# Patient Record
Sex: Male | Born: 1965 | Hispanic: No | Marital: Married | Smoking: Former smoker
Health system: Southern US, Community
[De-identification: ages and names within clinical notes are randomized; demographics above are authoritative.]

## PROBLEM LIST (undated history)

## (undated) DIAGNOSIS — I10 Essential (primary) hypertension: Secondary | ICD-10-CM

## (undated) DIAGNOSIS — R519 Headache, unspecified: Secondary | ICD-10-CM

## (undated) DIAGNOSIS — E041 Nontoxic single thyroid nodule: Secondary | ICD-10-CM

## (undated) DIAGNOSIS — J189 Pneumonia, unspecified organism: Secondary | ICD-10-CM

## (undated) DIAGNOSIS — K219 Gastro-esophageal reflux disease without esophagitis: Secondary | ICD-10-CM

## (undated) DIAGNOSIS — R011 Cardiac murmur, unspecified: Secondary | ICD-10-CM

## (undated) DIAGNOSIS — J449 Chronic obstructive pulmonary disease, unspecified: Secondary | ICD-10-CM

## (undated) DIAGNOSIS — C801 Malignant (primary) neoplasm, unspecified: Secondary | ICD-10-CM

## (undated) HISTORY — DX: Cardiac murmur, unspecified: R01.1

## (undated) HISTORY — DX: Essential (primary) hypertension: I10

## (undated) HISTORY — DX: Malignant (primary) neoplasm, unspecified: C80.1

## (undated) HISTORY — PX: FRACTURE SURGERY: SHX138

## (undated) HISTORY — DX: Gastro-esophageal reflux disease without esophagitis: K21.9

## (undated) HISTORY — DX: Chronic obstructive pulmonary disease, unspecified: J44.9

---

## 2019-04-15 DIAGNOSIS — Z20828 Contact with and (suspected) exposure to other viral communicable diseases: Secondary | ICD-10-CM | POA: Diagnosis not present

## 2019-04-21 DIAGNOSIS — U071 COVID-19: Secondary | ICD-10-CM | POA: Diagnosis not present

## 2019-04-21 DIAGNOSIS — Z20828 Contact with and (suspected) exposure to other viral communicable diseases: Secondary | ICD-10-CM | POA: Diagnosis not present

## 2019-06-02 DIAGNOSIS — L300 Nummular dermatitis: Secondary | ICD-10-CM | POA: Diagnosis not present

## 2019-08-20 DIAGNOSIS — Z7689 Persons encountering health services in other specified circumstances: Secondary | ICD-10-CM | POA: Diagnosis not present

## 2019-08-20 DIAGNOSIS — E785 Hyperlipidemia, unspecified: Secondary | ICD-10-CM | POA: Diagnosis not present

## 2019-08-20 DIAGNOSIS — E66811 Obesity, class 1: Secondary | ICD-10-CM | POA: Insufficient documentation

## 2019-08-20 DIAGNOSIS — I471 Supraventricular tachycardia, unspecified: Secondary | ICD-10-CM | POA: Insufficient documentation

## 2019-08-20 DIAGNOSIS — R002 Palpitations: Secondary | ICD-10-CM | POA: Diagnosis not present

## 2019-08-20 DIAGNOSIS — I2510 Atherosclerotic heart disease of native coronary artery without angina pectoris: Secondary | ICD-10-CM | POA: Insufficient documentation

## 2019-08-20 DIAGNOSIS — I10 Essential (primary) hypertension: Secondary | ICD-10-CM | POA: Diagnosis not present

## 2019-10-02 DIAGNOSIS — Z125 Encounter for screening for malignant neoplasm of prostate: Secondary | ICD-10-CM | POA: Diagnosis not present

## 2019-10-02 DIAGNOSIS — E785 Hyperlipidemia, unspecified: Secondary | ICD-10-CM | POA: Diagnosis not present

## 2019-10-02 DIAGNOSIS — Z8572 Personal history of non-Hodgkin lymphomas: Secondary | ICD-10-CM | POA: Insufficient documentation

## 2019-10-02 DIAGNOSIS — I2510 Atherosclerotic heart disease of native coronary artery without angina pectoris: Secondary | ICD-10-CM | POA: Diagnosis not present

## 2019-10-02 DIAGNOSIS — E049 Nontoxic goiter, unspecified: Secondary | ICD-10-CM | POA: Diagnosis not present

## 2019-10-02 DIAGNOSIS — I10 Essential (primary) hypertension: Secondary | ICD-10-CM | POA: Diagnosis not present

## 2019-10-02 DIAGNOSIS — J449 Chronic obstructive pulmonary disease, unspecified: Secondary | ICD-10-CM | POA: Diagnosis not present

## 2019-12-17 DIAGNOSIS — Z20822 Contact with and (suspected) exposure to covid-19: Secondary | ICD-10-CM | POA: Diagnosis not present

## 2019-12-17 DIAGNOSIS — Z03818 Encounter for observation for suspected exposure to other biological agents ruled out: Secondary | ICD-10-CM | POA: Diagnosis not present

## 2020-02-13 DIAGNOSIS — L578 Other skin changes due to chronic exposure to nonionizing radiation: Secondary | ICD-10-CM | POA: Diagnosis not present

## 2020-02-13 DIAGNOSIS — L814 Other melanin hyperpigmentation: Secondary | ICD-10-CM | POA: Diagnosis not present

## 2020-02-13 DIAGNOSIS — L905 Scar conditions and fibrosis of skin: Secondary | ICD-10-CM | POA: Diagnosis not present

## 2020-02-13 DIAGNOSIS — L570 Actinic keratosis: Secondary | ICD-10-CM | POA: Diagnosis not present

## 2020-02-13 DIAGNOSIS — D1801 Hemangioma of skin and subcutaneous tissue: Secondary | ICD-10-CM | POA: Diagnosis not present

## 2020-08-09 DIAGNOSIS — L814 Other melanin hyperpigmentation: Secondary | ICD-10-CM | POA: Diagnosis not present

## 2020-08-09 DIAGNOSIS — D1801 Hemangioma of skin and subcutaneous tissue: Secondary | ICD-10-CM | POA: Diagnosis not present

## 2020-08-09 DIAGNOSIS — D485 Neoplasm of uncertain behavior of skin: Secondary | ICD-10-CM | POA: Diagnosis not present

## 2020-08-09 DIAGNOSIS — D225 Melanocytic nevi of trunk: Secondary | ICD-10-CM | POA: Diagnosis not present

## 2020-08-09 DIAGNOSIS — L905 Scar conditions and fibrosis of skin: Secondary | ICD-10-CM | POA: Diagnosis not present

## 2020-08-09 DIAGNOSIS — L821 Other seborrheic keratosis: Secondary | ICD-10-CM | POA: Diagnosis not present

## 2020-10-26 DIAGNOSIS — I10 Essential (primary) hypertension: Secondary | ICD-10-CM | POA: Diagnosis not present

## 2020-10-26 DIAGNOSIS — E782 Mixed hyperlipidemia: Secondary | ICD-10-CM | POA: Diagnosis not present

## 2020-10-26 DIAGNOSIS — J439 Emphysema, unspecified: Secondary | ICD-10-CM | POA: Diagnosis not present

## 2020-12-22 DIAGNOSIS — U071 COVID-19: Secondary | ICD-10-CM | POA: Diagnosis not present

## 2021-02-07 DIAGNOSIS — L820 Inflamed seborrheic keratosis: Secondary | ICD-10-CM | POA: Diagnosis not present

## 2021-02-07 DIAGNOSIS — L905 Scar conditions and fibrosis of skin: Secondary | ICD-10-CM | POA: Diagnosis not present

## 2021-02-07 DIAGNOSIS — L538 Other specified erythematous conditions: Secondary | ICD-10-CM | POA: Diagnosis not present

## 2021-02-07 DIAGNOSIS — L918 Other hypertrophic disorders of the skin: Secondary | ICD-10-CM | POA: Diagnosis not present

## 2021-02-07 DIAGNOSIS — L568 Other specified acute skin changes due to ultraviolet radiation: Secondary | ICD-10-CM | POA: Diagnosis not present

## 2021-02-07 DIAGNOSIS — Z789 Other specified health status: Secondary | ICD-10-CM | POA: Diagnosis not present

## 2021-02-14 ENCOUNTER — Other Ambulatory Visit (HOSPITAL_BASED_OUTPATIENT_CLINIC_OR_DEPARTMENT_OTHER): Payer: Self-pay | Admitting: Family Medicine

## 2021-02-14 DIAGNOSIS — R1011 Right upper quadrant pain: Secondary | ICD-10-CM

## 2021-02-21 ENCOUNTER — Ambulatory Visit (HOSPITAL_BASED_OUTPATIENT_CLINIC_OR_DEPARTMENT_OTHER)
Admission: RE | Admit: 2021-02-21 | Discharge: 2021-02-21 | Disposition: A | Discharge status: Home / Self Care | Payer: BC Managed Care – PPO | Source: Ambulatory Visit | Attending: Family Medicine | Admitting: Family Medicine

## 2021-02-21 ENCOUNTER — Other Ambulatory Visit: Payer: Self-pay

## 2021-02-21 DIAGNOSIS — R1011 Right upper quadrant pain: Secondary | ICD-10-CM | POA: Insufficient documentation

## 2021-03-03 DIAGNOSIS — R1011 Right upper quadrant pain: Secondary | ICD-10-CM | POA: Diagnosis not present

## 2021-03-16 ENCOUNTER — Other Ambulatory Visit: Payer: Self-pay | Admitting: Physician Assistant

## 2021-03-16 DIAGNOSIS — Z1211 Encounter for screening for malignant neoplasm of colon: Secondary | ICD-10-CM | POA: Diagnosis not present

## 2021-03-16 DIAGNOSIS — R109 Unspecified abdominal pain: Secondary | ICD-10-CM

## 2021-03-16 DIAGNOSIS — K219 Gastro-esophageal reflux disease without esophagitis: Secondary | ICD-10-CM | POA: Diagnosis not present

## 2021-04-06 ENCOUNTER — Other Ambulatory Visit: Payer: Self-pay

## 2021-04-06 ENCOUNTER — Ambulatory Visit
Admission: RE | Admit: 2021-04-06 | Discharge: 2021-04-06 | Disposition: A | Discharge status: Home / Self Care | Payer: BC Managed Care – PPO | Source: Ambulatory Visit | Attending: Physician Assistant | Admitting: Physician Assistant

## 2021-04-06 DIAGNOSIS — M7989 Other specified soft tissue disorders: Secondary | ICD-10-CM | POA: Diagnosis not present

## 2021-04-06 DIAGNOSIS — J8410 Pulmonary fibrosis, unspecified: Secondary | ICD-10-CM | POA: Diagnosis not present

## 2021-04-06 DIAGNOSIS — R109 Unspecified abdominal pain: Secondary | ICD-10-CM | POA: Diagnosis not present

## 2021-04-06 DIAGNOSIS — K5730 Diverticulosis of large intestine without perforation or abscess without bleeding: Secondary | ICD-10-CM | POA: Diagnosis not present

## 2021-04-06 DIAGNOSIS — K429 Umbilical hernia without obstruction or gangrene: Secondary | ICD-10-CM | POA: Diagnosis not present

## 2021-04-06 DIAGNOSIS — I700 Atherosclerosis of aorta: Secondary | ICD-10-CM | POA: Diagnosis not present

## 2021-04-06 MED ORDER — IOPAMIDOL (ISOVUE-300) INJECTION 61%
100.0000 mL | Freq: Once | INTRAVENOUS | Status: AC | PRN
  Administered 2021-04-06: 100 mL via INTRAVENOUS

## 2021-04-25 DIAGNOSIS — Z125 Encounter for screening for malignant neoplasm of prostate: Secondary | ICD-10-CM | POA: Diagnosis not present

## 2021-04-25 DIAGNOSIS — J439 Emphysema, unspecified: Secondary | ICD-10-CM | POA: Diagnosis not present

## 2021-04-25 DIAGNOSIS — R109 Unspecified abdominal pain: Secondary | ICD-10-CM | POA: Diagnosis not present

## 2021-04-25 DIAGNOSIS — Z1211 Encounter for screening for malignant neoplasm of colon: Secondary | ICD-10-CM | POA: Diagnosis not present

## 2021-04-25 DIAGNOSIS — E782 Mixed hyperlipidemia: Secondary | ICD-10-CM | POA: Diagnosis not present

## 2021-04-25 DIAGNOSIS — I10 Essential (primary) hypertension: Secondary | ICD-10-CM | POA: Diagnosis not present

## 2021-04-25 DIAGNOSIS — Z0000 Encounter for general adult medical examination without abnormal findings: Secondary | ICD-10-CM | POA: Diagnosis not present

## 2021-05-04 DIAGNOSIS — E049 Nontoxic goiter, unspecified: Secondary | ICD-10-CM | POA: Diagnosis not present

## 2021-05-04 DIAGNOSIS — K219 Gastro-esophageal reflux disease without esophagitis: Secondary | ICD-10-CM | POA: Diagnosis not present

## 2021-05-04 DIAGNOSIS — Z1211 Encounter for screening for malignant neoplasm of colon: Secondary | ICD-10-CM | POA: Diagnosis not present

## 2021-05-04 DIAGNOSIS — R109 Unspecified abdominal pain: Secondary | ICD-10-CM | POA: Diagnosis not present

## 2021-08-02 DIAGNOSIS — D120 Benign neoplasm of cecum: Secondary | ICD-10-CM | POA: Diagnosis not present

## 2021-08-02 DIAGNOSIS — Z1211 Encounter for screening for malignant neoplasm of colon: Secondary | ICD-10-CM | POA: Diagnosis not present

## 2021-08-02 DIAGNOSIS — D128 Benign neoplasm of rectum: Secondary | ICD-10-CM | POA: Diagnosis not present

## 2021-08-02 DIAGNOSIS — K2930 Chronic superficial gastritis without bleeding: Secondary | ICD-10-CM | POA: Diagnosis not present

## 2021-08-02 DIAGNOSIS — D123 Benign neoplasm of transverse colon: Secondary | ICD-10-CM | POA: Diagnosis not present

## 2021-08-02 DIAGNOSIS — K219 Gastro-esophageal reflux disease without esophagitis: Secondary | ICD-10-CM | POA: Diagnosis not present

## 2021-08-02 DIAGNOSIS — D124 Benign neoplasm of descending colon: Secondary | ICD-10-CM | POA: Diagnosis not present

## 2021-08-02 DIAGNOSIS — D175 Benign lipomatous neoplasm of intra-abdominal organs: Secondary | ICD-10-CM | POA: Diagnosis not present

## 2021-08-02 DIAGNOSIS — R1011 Right upper quadrant pain: Secondary | ICD-10-CM | POA: Diagnosis not present

## 2021-08-02 DIAGNOSIS — D122 Benign neoplasm of ascending colon: Secondary | ICD-10-CM | POA: Diagnosis not present

## 2021-08-02 DIAGNOSIS — D125 Benign neoplasm of sigmoid colon: Secondary | ICD-10-CM | POA: Diagnosis not present

## 2021-08-02 DIAGNOSIS — K2990 Gastroduodenitis, unspecified, without bleeding: Secondary | ICD-10-CM | POA: Diagnosis not present

## 2021-08-02 LAB — HM COLONOSCOPY

## 2021-08-10 DIAGNOSIS — R933 Abnormal findings on diagnostic imaging of other parts of digestive tract: Secondary | ICD-10-CM | POA: Diagnosis not present

## 2021-08-10 DIAGNOSIS — K219 Gastro-esophageal reflux disease without esophagitis: Secondary | ICD-10-CM | POA: Diagnosis not present

## 2021-08-17 DIAGNOSIS — K219 Gastro-esophageal reflux disease without esophagitis: Secondary | ICD-10-CM | POA: Diagnosis not present

## 2021-08-29 DIAGNOSIS — J020 Streptococcal pharyngitis: Secondary | ICD-10-CM | POA: Diagnosis not present

## 2021-08-29 DIAGNOSIS — R059 Cough, unspecified: Secondary | ICD-10-CM | POA: Diagnosis not present

## 2021-12-13 ENCOUNTER — Encounter (HOSPITAL_BASED_OUTPATIENT_CLINIC_OR_DEPARTMENT_OTHER): Payer: Self-pay | Admitting: Family Medicine

## 2021-12-13 ENCOUNTER — Ambulatory Visit (INDEPENDENT_AMBULATORY_CARE_PROVIDER_SITE_OTHER): Payer: BC Managed Care – PPO | Admitting: Family Medicine

## 2021-12-13 DIAGNOSIS — I10 Essential (primary) hypertension: Secondary | ICD-10-CM

## 2021-12-13 DIAGNOSIS — J449 Chronic obstructive pulmonary disease, unspecified: Secondary | ICD-10-CM | POA: Diagnosis not present

## 2021-12-13 DIAGNOSIS — Z0000 Encounter for general adult medical examination without abnormal findings: Secondary | ICD-10-CM

## 2021-12-13 DIAGNOSIS — R109 Unspecified abdominal pain: Secondary | ICD-10-CM

## 2021-12-13 DIAGNOSIS — S80861A Insect bite (nonvenomous), right lower leg, initial encounter: Secondary | ICD-10-CM

## 2021-12-13 DIAGNOSIS — W57XXXA Bitten or stung by nonvenomous insect and other nonvenomous arthropods, initial encounter: Secondary | ICD-10-CM

## 2021-12-13 DIAGNOSIS — E785 Hyperlipidemia, unspecified: Secondary | ICD-10-CM | POA: Diagnosis not present

## 2021-12-13 NOTE — Assessment & Plan Note (Signed)
Blood pressure at goal in office today, can continue with current regimen Recommend intermittent monitoring of blood pressure at home, DASH diet

## 2021-12-13 NOTE — Assessment & Plan Note (Signed)
Plan to check lipid panel with upcoming labs for physical Has been tolerating rosuvastatin, recommend continuing with this

## 2021-12-13 NOTE — Progress Notes (Signed)
New Patient Office Visit  Subjective    Patient ID: Kyle Nielsen., male    DOB: [birth year]  Age: 56 y.o. MRN: [id number]  CC:  Chief Complaint  Patient presents with   New Patient (Initial Visit)    Patient presents today to establish care. Right side abdominal pain x 1 year.     HPI Kyle Mcdonald. presents to establish care Last PCP - Deboraha Sprang, last visit was a few months ago.  HTN: Diagnosed many years ago, current medications include amlodipine, lisinopril, metoprolol.  Medications have been updated in chart.  Denies any current issues with chest pain or headaches.  Does not check blood pressure regularly at home.  Not needing refills today.  COPD: Reports being diagnosed by pulmonologist when he was living in [place].  Started on Trelegy at that time, continues with this once daily.  Denies any current issues with shortness of breath or cough.  HLD: Currently taking rosuvastatin, tolerating medication well, denies any issues or concerns at this time.  Abdominal pain: intermittent over the past year or so. More recently has been improving. Not aware of any specific aggravating factors. Has had some small weight gain, not having any weight loss. Had evaluation with PCP at [facility], did have recommendation for evaluation with GI, had endoscopy and colonoscopy completed which he reports did not show anything.  At end of visit, reported some chronic fatigue Additionally he also mentioned exposures to tick in the past, indicating that he found one on his lower right leg.  Due to this he requested to have testing for Lyme disease in conjunction with upcoming labs  Patient is originally from [place], lived in [place] for past 35 years. Moved to the area about 3 years ago. Patient owns a [employer]. Outside of work, he enjoys relaxing, fishing.  Outpatient Encounter Medications as of 12/13/2021  Medication Sig   amLODipine (NORVASC) 10 MG tablet Take 10 mg by  mouth daily.   Fluticasone-Umeclidin-Vilant 100-62.5-25 MCG/ACT AEPB Inhale 1 Inhalation into the lungs daily.   lisinopril (ZESTRIL) 40 MG tablet Take 40 mg by mouth daily.   metoprolol tartrate (LOPRESSOR) 25 MG tablet Take 25 mg by mouth 2 (two) times daily.   pantoprazole (PROTONIX) 40 MG tablet Take 40 mg by mouth daily.   rosuvastatin (CRESTOR) 20 MG tablet Take 20 mg by mouth daily.   [DISCONTINUED] amlodipine-atorvastatin (CADUET) 10-10 MG tablet Take 1 tablet by mouth daily.   [DISCONTINUED] metoprolol-hydrochlorothiazide (LOPRESSOR HCT) 50-25 MG tablet Take 1 tablet by mouth daily.   No facility-administered encounter medications on file as of 12/13/2021.    History reviewed. No pertinent past medical history.  History reviewed. No pertinent surgical history.  History reviewed. No pertinent family history.  Social History   Socioeconomic History   Marital status: Married    Spouse name: Claris Che   Number of children: 2   Years of education: Not on file   Highest education level: Not on file  Occupational History   Not on file  Tobacco Use   Smoking status: Former    Types: Cigarettes    Quit date: 2012    Years since quitting: 11.6   Smokeless tobacco: Never  Substance and Sexual Activity   Alcohol use: Yes    Comment: couple beers a day   Drug use: Never   Sexual activity: Yes    Birth control/protection: None  Other Topics Concern   Not on file  Social History Narrative  Not on file   Social Determinants of Health   Financial Resource Strain: Not on file  Food Insecurity: Not on file  Transportation Needs: Not on file  Physical Activity: Not on file  Stress: Not on file  Social Connections: Not on file  Intimate Partner Violence: Not on file    Objective    BP 135/74   Pulse 79   Temp 98.7 F (37.1 C)   Ht 5\' 9"  (1.753 m)   Wt 224 lb (101.6 kg)   SpO2 97%   BMI 33.08 kg/m   Physical Exam  56 year old male in no acute  distress Cardiovascular exam with regular rate and rhythm, soft systolic murmur appreciated Lungs clear to auscultation bilaterally Abdomen with normal bowel sounds, soft, very mild tenderness along right flank.  No organomegaly.  Assessment & Plan:   Problem List Items Addressed This Visit       Cardiovascular and Mediastinum   Primary hypertension    Blood pressure at goal in office today, can continue with current regimen Recommend intermittent monitoring of blood pressure at home, DASH diet      Relevant Medications   rosuvastatin (CRESTOR) 20 MG tablet   lisinopril (ZESTRIL) 40 MG tablet   amLODipine (NORVASC) 10 MG tablet   metoprolol tartrate (LOPRESSOR) 25 MG tablet     Respiratory   COPD (chronic obstructive pulmonary disease) (HCC)    Diagnosed in the past, is doing well currently with trilogy.  Recommend continuing with regular use of inhaler Continue to monitor progress and need for PFTs in the future.  Consider referral to establish with pulmonologist locally      Relevant Medications   Fluticasone-Umeclidin-Vilant 100-62.5-25 MCG/ACT AEPB     Musculoskeletal and Integument   Tick bite of right lower leg    Patient reports that he had a tick bite to his right lower leg in the past with some surrounding redness and swelling.  He is requesting to have Lyme testing completed.  Orders have been placed to have Lyme testing done with upcoming labs      Relevant Orders   Lyme Disease Serology w/Reflex     Other   Hyperlipidemia    Plan to check lipid panel with upcoming labs for physical Has been tolerating rosuvastatin, recommend continuing with this      Relevant Medications   rosuvastatin (CRESTOR) 20 MG tablet   lisinopril (ZESTRIL) 40 MG tablet   amLODipine (NORVASC) 10 MG tablet   metoprolol tartrate (LOPRESSOR) 25 MG tablet   Abdominal pain    Primarily along right side of abdomen, uncertain etiology, exam today is largely unremarkable We will request  records from prior PCP to review evaluation which has been completed previously including labs and imaging.  Recommendations for further evaluation pending review of records We will plan to complete baseline labs with upcoming physical      Other Visit Diagnoses     Wellness examination       Relevant Orders   CBC with Differential/Platelet   Comprehensive metabolic panel   Hemoglobin A1c   Lipid panel   TSH Rfx on Abnormal to Free T4       Return in about 2 months (around 02/12/2022) for CPE with [facility] a few days prior.   Kellie Murrill J De 04/14/2022, MD

## 2021-12-13 NOTE — Assessment & Plan Note (Signed)
Diagnosed in the past, is doing well currently with trilogy.  Recommend continuing with regular use of inhaler Continue to monitor progress and need for PFTs in the future.  Consider referral to establish with pulmonologist locally

## 2021-12-13 NOTE — Assessment & Plan Note (Signed)
Patient reports that he had a tick bite to his right lower leg in the past with some surrounding redness and swelling.  He is requesting to have Lyme testing completed.  Orders have been placed to have Lyme testing done with upcoming labs

## 2021-12-13 NOTE — Patient Instructions (Signed)
  Medication Instructions:  Your physician recommends that you continue on your current medications as directed. Please refer to the Current Medication list given to you today. --If you need a refill on any your medications before your next appointment, please call your pharmacy first. If no refills are authorized on file call the office.-- Lab Work: Your physician has recommended that you have lab work today: nurse visit one week prior to appointment fasting labs If you have labs (blood work) drawn today and your tests are completely normal, you will receive your results via MyChart message OR a phone call from our staff.  Please ensure you check your voicemail in the event that you authorized detailed messages to be left on a delegated number. If you have any lab test that is abnormal or we need to change your treatment, we will call you to review the results.  R  Follow-Up: Your next appointment:   Your physician recommends that you schedule a follow-up appointment in: 2-3 months CPE with Dr. de [place]  You will receive a text message or e-mail with a link to a survey about your care and experience with [place] today! We would greatly appreciate your feedback!   Thanks for letting us be apart of your health journey!!  Primary Care and Sports Medicine   Dr. Ceasar Mons [place]   We encourage you to activate your patient portal called "MyChart".  Sign up information is provided on this After Visit Summary.  MyChart is used to connect with patients for Virtual Visits (Telemedicine).  Patients are able to view lab/test results, encounter notes, upcoming appointments, etc.  Non-urgent messages can be sent to your provider as well. To learn more about what you can do with MyChart, please visit --  [email or web address].

## 2021-12-13 NOTE — Assessment & Plan Note (Signed)
Primarily along right side of abdomen, uncertain etiology, exam today is largely unremarkable We will request records from prior PCP to review evaluation which has been completed previously including labs and imaging.  Recommendations for further evaluation pending review of records We will plan to complete baseline labs with upcoming physical

## 2022-02-06 ENCOUNTER — Ambulatory Visit (HOSPITAL_BASED_OUTPATIENT_CLINIC_OR_DEPARTMENT_OTHER): Payer: BC Managed Care – PPO

## 2022-02-06 ENCOUNTER — Encounter (HOSPITAL_BASED_OUTPATIENT_CLINIC_OR_DEPARTMENT_OTHER): Payer: Self-pay

## 2022-02-13 ENCOUNTER — Ambulatory Visit (INDEPENDENT_AMBULATORY_CARE_PROVIDER_SITE_OTHER): Payer: BC Managed Care – PPO | Admitting: Family Medicine

## 2022-02-13 ENCOUNTER — Encounter (HOSPITAL_BASED_OUTPATIENT_CLINIC_OR_DEPARTMENT_OTHER): Payer: Self-pay | Admitting: Family Medicine

## 2022-02-13 DIAGNOSIS — E049 Nontoxic goiter, unspecified: Secondary | ICD-10-CM | POA: Diagnosis not present

## 2022-02-13 DIAGNOSIS — S80861A Insect bite (nonvenomous), right lower leg, initial encounter: Secondary | ICD-10-CM | POA: Diagnosis not present

## 2022-02-13 DIAGNOSIS — Z0000 Encounter for general adult medical examination without abnormal findings: Secondary | ICD-10-CM

## 2022-02-13 DIAGNOSIS — W57XXXA Bitten or stung by nonvenomous insect and other nonvenomous arthropods, initial encounter: Secondary | ICD-10-CM | POA: Diagnosis not present

## 2022-02-13 NOTE — Assessment & Plan Note (Signed)
Routine HCM labs ordered. HCM reviewed/discussed. Anticipatory guidance regarding healthy weight, lifestyle and choices given. Recommend healthy diet.  Recommend approximately 150 minutes/week of moderate intensity exercise Recommend regular dental and vision exams Always use seatbelt/lap and shoulder restraints Recommend using smoke alarms and checking batteries at least twice a year Recommend using sunscreen when outside Discussed colon cancer screening recommendations, options.  Patient has had colonoscopy with [facility] in the recent past Discussed recommendations for shingles vaccine.  Patient declines at this time Discussed tetanus immunization recommendations, patient is due but declines Recommend season flu vaccine, patient declines today

## 2022-02-13 NOTE — Progress Notes (Unsigned)
Subjective:    CC: Annual Physical Exam  HPI:  Kyle Demma. is a 56 y.o. presenting for annual physical  I reviewed the past medical history, family history, social history, surgical history, and allergies today and no changes were needed.  Please see the problem list section below in epic for further details.  Past Medical History: History reviewed. No pertinent past medical history. Past Surgical History: History reviewed. No pertinent surgical history. Social History: Social History   Socioeconomic History   Marital status: Married    Spouse name: Kyle Mcdonald   Number of children: 2   Years of education: Not on file   Highest education level: Not on file  Occupational History   Not on file  Tobacco Use   Smoking status: Former    Types: Cigarettes    Quit date: 2012    Years since quitting: 11.7   Smokeless tobacco: Never  Substance and Sexual Activity   Alcohol use: Yes    Comment: couple beers a day   Drug use: Never   Sexual activity: Yes    Birth control/protection: None  Other Topics Concern   Not on file  Social History Narrative   Not on file   Social Determinants of Health   Financial Resource Strain: Not on file  Food Insecurity: Not on file  Transportation Needs: Not on file  Physical Activity: Not on file  Stress: Not on file  Social Connections: Not on file   Family History: History reviewed. No pertinent family history. Allergies: Not on File Medications: See med rec.  Review of Systems: No headache, visual changes, nausea, vomiting, diarrhea, constipation, dizziness, abdominal pain, skin rash, fevers, chills, night sweats, swollen lymph nodes, weight loss, chest pain, body aches, joint swelling, muscle aches, shortness of breath, mood changes, visual or auditory hallucinations.  Objective:    BP 130/82   Pulse 74   Temp 97.6 F (36.4 C) (Oral)   Ht 5\' 9"  (1.753 m)   Wt 223 lb 6.4 oz (101.3 kg)   SpO2 97%   BMI 32.99 kg/m    General: Well Developed, well nourished, and in no acute distress.  Neuro: Alert and oriented x3, extra-ocular muscles intact, sensation grossly intact. Cranial nerves II through XII are intact, motor, sensory, and coordinative functions are all intact. HEENT: Normocephalic, atraumatic, pupils equal round reactive to light, neck supple, no lymphadenopathy, thyroid enlarged along right side of neck. Oropharynx, nasopharynx, external ear canals are unremarkable. Skin: Warm and dry, no rashes noted.  Cardiac: Regular rate and rhythm, no murmurs rubs or gallops.  Respiratory: Clear to auscultation bilaterally. Not using accessory muscles, speaking in full sentences.  Abdominal: Soft, nontender, nondistended, positive bowel sounds, no masses, no organomegaly.  Musculoskeletal: Shoulder, elbow, wrist, hip, knee, ankle stable, and with full range of motion.  Impression and Recommendations:    Wellness examination Routine HCM labs ordered. HCM reviewed/discussed. Anticipatory guidance regarding healthy weight, lifestyle and choices given. Recommend healthy diet.  Recommend approximately 150 minutes/week of moderate intensity exercise Recommend regular dental and vision exams Always use seatbelt/lap and shoulder restraints Recommend using smoke alarms and checking batteries at least twice a year Recommend using sunscreen when outside Discussed colon cancer screening recommendations, options.  Patient has had colonoscopy with [facility] in the recent past Discussed recommendations for shingles vaccine.  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  He reports prior evaluation with biopsy many years ago, thinks it was about 20 years ago that this was done.  Indicates that results at that time were reportedly reassuring and he was told that  no further testing or treatment was needed He has not had any new symptoms, no obstructive symptoms. We will check TSH, TPO antibodies and determine need for further evaluation at this time such as ultrasound or possible biopsy  Return in about 3 months (around 05/16/2022) for HTN, HLD.   ___________________________________________ Kyle Livingood de [place], MD, ABFM, [facility] and [place]

## 2022-02-13 NOTE — Patient Instructions (Signed)
  Medication Instructions:  Your physician recommends that you continue on your current medications as directed. Please refer to the Current Medication list given to you today. --If you need a refill on any your medications before your next appointment, please call your pharmacy first. If no refills are authorized on file call the office.-- Lab Work: Your physician has recommended that you have lab work today: Yes If you have labs (blood work) drawn today and your tests are completely normal, you will receive your results via MyChart message OR a phone call from our staff.  Please ensure you check your voicemail in the event that you authorized detailed messages to be left on a delegated number. If you have any lab test that is abnormal or we need to change your treatment, we will call you to review the results.  Referrals/Procedures/Imaging: No  Follow-Up: Your next appointment:   Your physician recommends that you schedule a follow-up appointment in: 3-4 months follow-up with Dr. de [place].  You will receive a text message or e-mail with a link to a survey about your care and experience with us today! We would greatly appreciate your feedback!   Thanks for letting us be apart of your health journey!!  Primary Care and Sports Medicine   Dr. Raymond de [place]   We encourage you to activate your patient portal called "MyChart".  Sign up information is provided on this After Visit Summary.  MyChart is used to connect with patients for Virtual Visits (Telemedicine).  Patients are able to view lab/test results, encounter notes, upcoming appointments, etc.  Non-urgent messages can be sent to your provider as well. To learn more about what you can do with MyChart, please visit --  [email or web address]    

## 2022-02-14 LAB — COMPREHENSIVE METABOLIC PANEL
ALT: 40 IU/L (ref 0–44)
AST: 23 IU/L (ref 0–40)
Albumin/Globulin Ratio: 1.9 (ref 1.2–2.2)
Albumin: 4.3 g/dL (ref 3.8–4.9)
Alkaline Phosphatase: 93 IU/L (ref 44–121)
BUN/Creatinine Ratio: 13 (ref 9–20)
BUN: 13 mg/dL (ref 6–24)
Bilirubin Total: 0.7 mg/dL (ref 0.0–1.2)
CO2: 20 mmol/L (ref 20–29)
Calcium: 9.2 mg/dL (ref 8.7–10.2)
Chloride: 105 mmol/L (ref 96–106)
Creatinine, Ser: 1.04 mg/dL (ref 0.76–1.27)
Globulin, Total: 2.3 g/dL (ref 1.5–4.5)
Glucose: 139 mg/dL — ABNORMAL HIGH (ref 70–99)
Potassium: 4.3 mmol/L (ref 3.5–5.2)
Sodium: 141 mmol/L (ref 134–144)
Total Protein: 6.6 g/dL (ref 6.0–8.5)
eGFR: 84 mL/min/{1.73_m2} (ref >59)

## 2022-02-14 LAB — LIPID PANEL
Chol/HDL Ratio: 2.7 ratio (ref 0.0–5.0)
Cholesterol, Total: 86 mg/dL — ABNORMAL LOW (ref 100–199)
HDL: 32 mg/dL — ABNORMAL LOW (ref >39)
LDL Chol Calc (NIH): 38 mg/dL (ref 0–99)
Triglycerides: 72 mg/dL (ref 0–149)
VLDL Cholesterol Cal: 16 mg/dL (ref 5–40)

## 2022-02-14 LAB — HEMOGLOBIN A1C
Est. average glucose Bld gHb Est-mCnc: 120 mg/dL
Hgb A1c MFr Bld: 5.8 % — ABNORMAL HIGH (ref 4.8–5.6)

## 2022-02-14 LAB — CBC WITH DIFFERENTIAL/PLATELET
Basophils Absolute: 0.1 10*3/uL (ref 0.0–0.2)
Basos: 1 %
EOS (ABSOLUTE): 0.3 10*3/uL (ref 0.0–0.4)
Eos: 4 %
Hematocrit: 46 % (ref 37.5–51.0)
Hemoglobin: 16.2 g/dL (ref 13.0–17.7)
Immature Grans (Abs): 0 10*3/uL (ref 0.0–0.1)
Immature Granulocytes: 1 %
Lymphocytes Absolute: 1.3 10*3/uL (ref 0.7–3.1)
Lymphs: 23 %
MCH: 31.3 pg (ref 26.6–33.0)
MCHC: 35.2 g/dL (ref 31.5–35.7)
MCV: 89 fL (ref 79–97)
Monocytes Absolute: 0.7 10*3/uL (ref 0.1–0.9)
Monocytes: 11 %
Neutrophils Absolute: 3.4 10*3/uL (ref 1.4–7.0)
Neutrophils: 60 %
Platelets: 196 10*3/uL (ref 150–450)
RBC: 5.17 x10E6/uL (ref 4.14–5.80)
RDW: 12.3 % (ref 11.6–15.4)
WBC: 5.7 10*3/uL (ref 3.4–10.8)

## 2022-02-14 LAB — TSH RFX ON ABNORMAL TO FREE T4: TSH: 6.27 u[IU]/mL — ABNORMAL HIGH (ref 0.450–4.500)

## 2022-02-14 LAB — T4F: T4,Free (Direct): 1.22 ng/dL (ref 0.82–1.77)

## 2022-02-14 LAB — LYME DISEASE SEROLOGY W/REFLEX: Lyme Total Antibody EIA: NEGATIVE

## 2022-02-14 LAB — THYROID PEROXIDASE ANTIBODY: Thyroperoxidase Ab SerPl-aCnc: <9 IU/mL (ref 0–34)

## 2022-02-14 NOTE — Assessment & Plan Note (Signed)
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  He reports prior evaluation with biopsy many years ago, thinks it was about 20 years ago that this was done.  Indicates that results at that time were reportedly reassuring and he was told that no further testing or treatment was needed He has not had any new symptoms, no obstructive symptoms. We will check TSH, TPO antibodies and determine need for further evaluation at this time such as ultrasound or possible biopsy

## 2022-02-23 ENCOUNTER — Other Ambulatory Visit (HOSPITAL_BASED_OUTPATIENT_CLINIC_OR_DEPARTMENT_OTHER): Payer: Self-pay | Admitting: Family Medicine

## 2022-02-23 DIAGNOSIS — E049 Nontoxic goiter, unspecified: Secondary | ICD-10-CM

## 2022-03-06 ENCOUNTER — Ambulatory Visit
Admission: RE | Admit: 2022-03-06 | Discharge: 2022-03-06 | Disposition: A | Discharge status: Home / Self Care | Payer: BC Managed Care – PPO | Source: Ambulatory Visit | Attending: Family Medicine | Admitting: Family Medicine

## 2022-03-06 DIAGNOSIS — E049 Nontoxic goiter, unspecified: Secondary | ICD-10-CM

## 2022-03-06 DIAGNOSIS — E042 Nontoxic multinodular goiter: Secondary | ICD-10-CM | POA: Diagnosis not present

## 2022-04-12 ENCOUNTER — Encounter (HOSPITAL_BASED_OUTPATIENT_CLINIC_OR_DEPARTMENT_OTHER): Payer: Self-pay | Admitting: Family Medicine

## 2022-04-28 DIAGNOSIS — L821 Other seborrheic keratosis: Secondary | ICD-10-CM | POA: Diagnosis not present

## 2022-04-28 DIAGNOSIS — L814 Other melanin hyperpigmentation: Secondary | ICD-10-CM | POA: Diagnosis not present

## 2022-04-28 DIAGNOSIS — D225 Melanocytic nevi of trunk: Secondary | ICD-10-CM | POA: Diagnosis not present

## 2022-04-28 DIAGNOSIS — L300 Nummular dermatitis: Secondary | ICD-10-CM | POA: Diagnosis not present

## 2022-05-16 ENCOUNTER — Encounter (HOSPITAL_BASED_OUTPATIENT_CLINIC_OR_DEPARTMENT_OTHER): Payer: Self-pay | Admitting: Family Medicine

## 2022-05-16 ENCOUNTER — Ambulatory Visit (HOSPITAL_BASED_OUTPATIENT_CLINIC_OR_DEPARTMENT_OTHER): Payer: BC Managed Care – PPO | Admitting: Family Medicine

## 2022-05-16 VITALS — BP 137/89 | HR 75 | Ht 69.0 in | Wt 225.9 lb

## 2022-05-16 DIAGNOSIS — E049 Nontoxic goiter, unspecified: Secondary | ICD-10-CM

## 2022-05-16 DIAGNOSIS — I10 Essential (primary) hypertension: Secondary | ICD-10-CM | POA: Diagnosis not present

## 2022-05-16 DIAGNOSIS — R7303 Prediabetes: Secondary | ICD-10-CM | POA: Insufficient documentation

## 2022-05-16 NOTE — Progress Notes (Signed)
    Procedures performed today:    None.  Independent interpretation of notes and tests performed by another provider:   None.  Brief History, Exam, Impression, and Recommendations:    BP 137/89   Pulse 75   Ht 5\' 9"  (1.753 m)   Wt 225 lb 14.4 oz (102.5 kg)   SpO2 100%   BMI 33.36 kg/m   Primary hypertension Blood pressure initially borderline elevated, did improve on recheck.  He continues with amlodipine, lisinopril, metoprolol.  No reported issues with chest pain, headaches, lightheadedness or dizziness.  Does not need refill on medications today At this time, can continue with current medication regimen, no changes to be made today Recommend intermittent monitoring of blood pressure at home, DASH diet  Goiter Patient denies any new issues such as voice changes, trouble swallowing, breathing, issues with pain. Recent evaluation completed including labs and ultrasound.  Given results of ultrasound, recommendation was to proceed with endocrinology evaluation and determine role for further testing such as biopsy.  Discussed labs and imaging results today, patient is amenable to further evaluation with local endocrinologist, referral placed today.  Advised that if wait time for appointment with endocrinology is somewhat long, recommend reaching out to [place] and we can look into alternative referral options  Prediabetes Noted on recent labs, mild elevation in hemoglobin A1c at 5.8%.  Primary recommendation to address with lifestyle modifications, we will continue to monitor hemoglobin A1c every several months or so or sooner as indicated should symptoms develop  Return in about 4 months (around 09/14/2022) for HTN, goiter.   ___________________________________________ Destine Ambroise de [place], MD, ABFM, [facility] and [place]

## 2022-05-16 NOTE — Assessment & Plan Note (Addendum)
Patient denies any new issues such as voice changes, trouble swallowing, breathing, issues with pain. Recent evaluation completed including labs and ultrasound.  Given results of ultrasound, recommendation was to proceed with endocrinology evaluation and determine role for further testing such as biopsy.  Discussed labs and imaging results today, patient is amenable to further evaluation with local endocrinologist, referral placed today.  Advised that if wait time for appointment with endocrinology is somewhat long, recommend reaching out to [place] and we can look into alternative referral options

## 2022-05-16 NOTE — Patient Instructions (Addendum)
  Medication Instructions:  Your physician recommends that you continue on your current medications as directed. Please refer to the Current Medication list given to you today. --If you need a refill on any your medications before your next appointment, please call your pharmacy first. If no refills are authorized on file call the office.-- Lab Work: Your physician has recommended that you have lab work today: No If you have labs (blood work) drawn today and your tests are completely normal, you will receive your results via [place] a phone call from our staff.  Please ensure you check your voicemail in the event that you authorized detailed messages to be left on a delegated number. If you have any lab test that is abnormal or we need to change your treatment, we will call you to review the results.  Referrals/Procedures/Imaging: Yes  Follow-Up: Your next appointment:   Your physician recommends that you schedule a follow-up appointment in: 2-4 months with Dr. de [place].  You will receive a text message or e-mail with a link to a survey about your care and experience with [place] today! We would greatly appreciate your feedback!   Thanks for letting us be apart of your health journey!!  Primary Care and Sports Medicine   Dr. Arlina Robes [place]   We encourage you to activate your patient portal called "MyChart".  Sign up information is provided on this After Visit Summary.  MyChart is used to connect with patients for Virtual Visits (Telemedicine).  Patients are able to view lab/test results, encounter notes, upcoming appointments, etc.  Non-urgent messages can be sent to your provider as well. To learn more about what you can do with MyChart, please visit --  [email or web address].

## 2022-05-16 NOTE — Assessment & Plan Note (Signed)
Noted on recent labs, mild elevation in hemoglobin A1c at 5.8%.  Primary recommendation to address with lifestyle modifications, we will continue to monitor hemoglobin A1c every several months or so or sooner as indicated should symptoms develop

## 2022-05-16 NOTE — Assessment & Plan Note (Signed)
Blood pressure initially borderline elevated, did improve on recheck.  He continues with amlodipine, lisinopril, metoprolol.  No reported issues with chest pain, headaches, lightheadedness or dizziness.  Does not need refill on medications today At this time, can continue with current medication regimen, no changes to be made today Recommend intermittent monitoring of blood pressure at home, DASH diet

## 2022-06-19 ENCOUNTER — Emergency Department (HOSPITAL_BASED_OUTPATIENT_CLINIC_OR_DEPARTMENT_OTHER): Payer: BC Managed Care – PPO

## 2022-06-19 ENCOUNTER — Other Ambulatory Visit: Payer: Self-pay

## 2022-06-19 ENCOUNTER — Telehealth (HOSPITAL_BASED_OUTPATIENT_CLINIC_OR_DEPARTMENT_OTHER): Payer: Self-pay

## 2022-06-19 ENCOUNTER — Other Ambulatory Visit (HOSPITAL_BASED_OUTPATIENT_CLINIC_OR_DEPARTMENT_OTHER): Payer: Self-pay

## 2022-06-19 ENCOUNTER — Emergency Department (HOSPITAL_BASED_OUTPATIENT_CLINIC_OR_DEPARTMENT_OTHER)
Admission: EM | Admit: 2022-06-19 | Discharge: 2022-06-19 | Disposition: A | Discharge status: Home / Self Care | Payer: BC Managed Care – PPO | Attending: Emergency Medicine | Admitting: Emergency Medicine

## 2022-06-19 ENCOUNTER — Encounter (HOSPITAL_BASED_OUTPATIENT_CLINIC_OR_DEPARTMENT_OTHER): Payer: Self-pay | Admitting: Emergency Medicine

## 2022-06-19 ENCOUNTER — Ambulatory Visit (HOSPITAL_BASED_OUTPATIENT_CLINIC_OR_DEPARTMENT_OTHER): Payer: BC Managed Care – PPO | Admitting: Family Medicine

## 2022-06-19 DIAGNOSIS — Z79899 Other long term (current) drug therapy: Secondary | ICD-10-CM | POA: Insufficient documentation

## 2022-06-19 DIAGNOSIS — J208 Acute bronchitis due to other specified organisms: Secondary | ICD-10-CM | POA: Insufficient documentation

## 2022-06-19 DIAGNOSIS — J101 Influenza due to other identified influenza virus with other respiratory manifestations: Secondary | ICD-10-CM | POA: Insufficient documentation

## 2022-06-19 DIAGNOSIS — R042 Hemoptysis: Secondary | ICD-10-CM | POA: Diagnosis not present

## 2022-06-19 DIAGNOSIS — I10 Essential (primary) hypertension: Secondary | ICD-10-CM | POA: Insufficient documentation

## 2022-06-19 DIAGNOSIS — Z1152 Encounter for screening for COVID-19: Secondary | ICD-10-CM | POA: Diagnosis not present

## 2022-06-19 DIAGNOSIS — J441 Chronic obstructive pulmonary disease with (acute) exacerbation: Secondary | ICD-10-CM | POA: Insufficient documentation

## 2022-06-19 DIAGNOSIS — J209 Acute bronchitis, unspecified: Secondary | ICD-10-CM | POA: Diagnosis not present

## 2022-06-19 DIAGNOSIS — R059 Cough, unspecified: Secondary | ICD-10-CM | POA: Diagnosis not present

## 2022-06-19 LAB — BASIC METABOLIC PANEL
Anion gap: 9 (ref 5–15)
BUN: 11 mg/dL (ref 6–20)
CO2: 24 mmol/L (ref 22–32)
Calcium: 9.2 mg/dL (ref 8.9–10.3)
Chloride: 104 mmol/L (ref 98–111)
Creatinine, Ser: 0.96 mg/dL (ref 0.61–1.24)
GFR, Estimated: >60 mL/min (ref >60)
Glucose, Bld: 128 mg/dL — ABNORMAL HIGH (ref 70–99)
Potassium: 4.9 mmol/L (ref 3.5–5.1)
Sodium: 137 mmol/L (ref 135–145)

## 2022-06-19 LAB — CBC WITH DIFFERENTIAL/PLATELET
Abs Immature Granulocytes: 0.03 10*3/uL (ref 0.00–0.07)
Basophils Absolute: 0 10*3/uL (ref 0.0–0.1)
Basophils Relative: 1 %
Eosinophils Absolute: 0.1 10*3/uL (ref 0.0–0.5)
Eosinophils Relative: 2 %
HCT: 45.5 % (ref 39.0–52.0)
Hemoglobin: 16.1 g/dL (ref 13.0–17.0)
Immature Granulocytes: 0 %
Lymphocytes Relative: 12 %
Lymphs Abs: 0.9 10*3/uL (ref 0.7–4.0)
MCH: 31.3 pg (ref 26.0–34.0)
MCHC: 35.4 g/dL (ref 30.0–36.0)
MCV: 88.3 fL (ref 80.0–100.0)
Monocytes Absolute: 0.8 10*3/uL (ref 0.1–1.0)
Monocytes Relative: 12 %
Neutro Abs: 5.4 10*3/uL (ref 1.7–7.7)
Neutrophils Relative %: 73 %
Platelets: 148 10*3/uL — ABNORMAL LOW (ref 150–400)
RBC: 5.15 MIL/uL (ref 4.22–5.81)
RDW: 12.7 % (ref 11.5–15.5)
WBC: 7.3 10*3/uL (ref 4.0–10.5)
nRBC: 0 % (ref 0.0–0.2)

## 2022-06-19 LAB — RESP PANEL BY RT-PCR (RSV, FLU A&B, COVID)  RVPGX2
Influenza A by PCR: POSITIVE — AB
Influenza B by PCR: NEGATIVE
Resp Syncytial Virus by PCR: NEGATIVE
SARS Coronavirus 2 by RT PCR: NEGATIVE

## 2022-06-19 LAB — D-DIMER, QUANTITATIVE: D-Dimer, Quant: 0.78 ug/mL-FEU — ABNORMAL HIGH (ref 0.00–0.50)

## 2022-06-19 MED ORDER — METHYLPREDNISOLONE 4 MG PO TBPK: ORAL_TABLET | ORAL | 0 refills | Status: DC

## 2022-06-19 MED ORDER — BENZONATATE 100 MG PO CAPS: 100.0000 mg | ORAL_CAPSULE | Freq: Three times a day (TID) | ORAL | 0 refills | Status: DC

## 2022-06-19 MED ORDER — OSELTAMIVIR PHOSPHATE 75 MG PO CAPS: 75.0000 mg | ORAL_CAPSULE | Freq: Two times a day (BID) | ORAL | 0 refills | Status: DC

## 2022-06-19 MED ORDER — MIDAZOLAM HCL 2 MG/2ML IJ SOLN: 4.0000 mg | Freq: Once | INTRAMUSCULAR | Status: DC

## 2022-06-19 MED ORDER — IOHEXOL 350 MG/ML SOLN
100.0000 mL | Freq: Once | INTRAVENOUS | Status: AC | PRN
  Administered 2022-06-19: 75 mL via INTRAVENOUS

## 2022-06-19 MED ORDER — ALBUTEROL SULFATE HFA 108 (90 BASE) MCG/ACT IN AERS: 1.0000 | INHALATION_SPRAY | Freq: Four times a day (QID) | RESPIRATORY_TRACT | 0 refills | Status: DC | PRN

## 2022-06-19 NOTE — ED Provider Notes (Signed)
[facility] Provider Note   CSN: [id number] Arrival date & time: 06/19/22  1014     History  Chief Complaint  Patient presents with   Hemoptysis    Kyle Mcdonald. is a 57 y.o. male.  HPI   57 year old male with medical history significant for COPD not on home O2, HTN, HLD who presents to the emergency department with an episode of hemoptysis.  Patient states that he had bloody sputum that started yesterday.  He describes a small volume of blood in his sputum.  He has had a productive cough, congestion, sore throat and headaches in that same time.   Home Medications Prior to Admission medications   Medication Sig Start Date End Date Taking? Authorizing Provider  albuterol (VENTOLIN HFA) 108 (90 Base) MCG/ACT inhaler Inhale 1-2 puffs into the lungs every 6 (six) hours as needed for wheezing or shortness of breath. 06/19/22   Regan Lemming, MD  amLODipine (NORVASC) 10 MG tablet Take 10 mg by mouth daily.    [provider]  benzonatate (TESSALON) 100 MG capsule Take 1 capsule (100 mg total) by mouth every 8 (eight) hours. 06/19/22   Regan Lemming, MD  clobetasol cream (TEMOVATE) [id number] % Apply 1 Application topically 2 (two) times daily. 04/28/22   [provider]  Fluticasone-Umeclidin-Vilant 100-62.5-25 MCG/ACT AEPB Inhale 1 Inhalation into the lungs daily.    [provider]  lisinopril (ZESTRIL) 40 MG tablet Take 40 mg by mouth daily.    [provider]  methylPREDNISolone (MEDROL DOSEPAK) 4 MG TBPK tablet Take as prescribed on the box 06/19/22   Regan Lemming, MD  metoprolol tartrate (LOPRESSOR) 25 MG tablet Take 25 mg by mouth 2 (two) times daily.    [provider]  oseltamivir (TAMIFLU) 75 MG capsule Take 1 capsule (75 mg total) by mouth every 12 (twelve) hours. 06/19/22   Regan Lemming, MD  pantoprazole (PROTONIX) 40 MG tablet Take 40 mg by mouth daily.    [provider]   rosuvastatin (CRESTOR) 20 MG tablet Take 20 mg by mouth daily.    [provider]      Allergies    Patient has no known allergies.    Review of Systems   Review of Systems  Respiratory:  Positive for cough.     Physical Exam Updated Vital Signs BP (!) 137/100   Pulse 89   Temp 98.5 F (36.9 C) (Oral)   Resp 20   Ht 5' 9"$  (1.753 m)   Wt 102.1 kg   SpO2 98%   BMI 33.23 kg/m  Physical Exam Vitals and nursing note reviewed.  Constitutional:      General: He is not in acute distress.    Appearance: He is well-developed.  HENT:     Head: Normocephalic and atraumatic.  Eyes:     Conjunctiva/sclera: Conjunctivae normal.  Cardiovascular:     Rate and Rhythm: Normal rate and regular rhythm.     Heart sounds: Murmur heard.  Pulmonary:     Effort: Pulmonary effort is normal. No respiratory distress.     Breath sounds: Wheezing present.  Abdominal:     Palpations: Abdomen is soft.     Tenderness: There is no abdominal tenderness.  Musculoskeletal:        General: No swelling.     Cervical back: Neck supple.  Skin:    General: Skin is warm and dry.     Capillary Refill: Capillary refill takes less  than 2 seconds.  Neurological:     Mental Status: He is alert.  Psychiatric:        Mood and Affect: Mood normal.     ED Results / Procedures / Treatments   Labs (all labs ordered are listed, but only abnormal results are displayed) Labs Reviewed  RESP PANEL BY RT-PCR (RSV, FLU A&B, COVID)  RVPGX2 - Abnormal; Notable for the following components:      Result Value   Influenza A by PCR POSITIVE (*)    All other components within normal limits  CBC WITH DIFFERENTIAL/PLATELET - Abnormal; Notable for the following components:   Platelets 148 (*)    All other components within normal limits  BASIC METABOLIC PANEL - Abnormal; Notable for the following components:   Glucose, Bld 128 (*)    All other components within normal limits  D-DIMER, QUANTITATIVE - Abnormal;  Notable for the following components:   D-Dimer, Quant 0.78 (*)    All other components within normal limits    EKG None  Radiology CT Angio Chest PE W and/or Wo Contrast  Result Date: 06/19/2022 CLINICAL DATA:  Pulmonary embolism suspected. Hemoptysis. Possible lung mass. Chest congestion, sore throat, headache. EXAM: CT ANGIOGRAPHY CHEST WITH CONTRAST TECHNIQUE: Multidetector CT imaging of the chest was performed using the standard protocol during bolus administration of intravenous contrast. Multiplanar CT image reconstructions and MIPs were obtained to evaluate the vascular anatomy. RADIATION DOSE REDUCTION: This exam was performed according to the departmental dose-optimization program which includes automated exposure control, adjustment of the mA and/or kV according to patient size and/or use of iterative reconstruction technique. CONTRAST:  33m OMNIPAQUE IOHEXOL 350 MG/ML SOLN COMPARISON:  Chest x-ray on 06/19/2022 FINDINGS: Cardiovascular: Pulmonary arteries are well opacified. There is no acute pulmonary embolus. Heart size is normal. There is no pericardial effusion. Coronary artery calcifications are present. There is atherosclerosis of the thoracic aorta, not associated with aneurysm. Mediastinum/Nodes: Partially imaged RIGHT lobe of the thyroid is enlarged, RIGHT thyroid nodule measuring at least 5.6 centimeters in diameter. The trachea is deviated towards the LEFT as result of this mass. The visualized LEFT lobe is unremarkable. Esophagus is unremarkable. No retroperitoneal or mesenteric adenopathy. Lungs/Pleura: The airways are patent. A calcified nodule in the RIGHT LATERAL LOWER lobe is consistent with benign granuloma, measuring 3 millimeters. There is volume loss within the apical segment of the RIGHT UPPER lobe, associated bronchovascular crowding and possible atelectasis or scarring there is minimal scarring also within the LEFT UPPER lobe. No suspicious pulmonary nodules are  present. No focal consolidations or pleural effusions. Upper Abdomen: No acute abnormality. Musculoskeletal: No chest wall abnormality. No acute or significant osseous findings. Review of the MIP images confirms the above findings. IMPRESSION: 1. Technically adequate exam showing no acute pulmonary embolus. 2. Enlarged RIGHT lobe of the thyroid, measuring at least 5.6 centimeters in diameter. Recommend non-emergent thyroid ultrasound. Reference: J Am Coll Radiol. 2015 Feb;12(2): 143-50 3. Coronary artery calcifications. 4. Volume loss with atelectasis or scarring in the apical segment of the RIGHT UPPER lobe. 5.  Aortic Atherosclerosis (ICD10-I70.0). Electronically Signed   By: ENolon NationsM.D.   On: 06/19/2022 13:22   DG Chest Portable 1 View  Result Date: 06/19/2022 CLINICAL DATA:  Hemoptysis, cough EXAM: PORTABLE CHEST 1 VIEW COMPARISON:  None Available. FINDINGS: The heart size and mediastinal contours are within normal limits. Irregular suprahilar opacity of the right lung, suspicious for airspace disease, mass, or fibrosis. The visualized skeletal structures are unremarkable. IMPRESSION:  Irregular suprahilar opacity of the right lung, suspicious for airspace disease, mass, or fibrosis. Recommend CT to further evaluate in the setting of hemoptysis. Electronically Signed   By: Delanna Ahmadi M.D.   On: 06/19/2022 11:22    Procedures Procedures    Medications Ordered in ED Medications  iohexol (OMNIPAQUE) 350 MG/ML injection 100 mL (75 mLs Intravenous Contrast Given 06/19/22 1245)    ED Course/ Medical Decision Making/ A&P Clinical Course as of 06/19/22 1542  Mon Jun 19, 2022  1348 Influenza A By PCR(!): POSITIVE [JL]    Clinical Course User Index [JL] Regan Lemming, MD                             Medical Decision Making Amount and/or Complexity of Data Reviewed Labs: ordered. Decision-making details documented in ED Course. Radiology: ordered.  Risk Prescription drug  management.    57 year old male with medical history significant for COPD not on home O2, HTN, HLD who presents to the emergency department with an episode of hemoptysis.  Patient states that he had bloody sputum that started yesterday.  He describes a small volume of blood in his sputum.  He has had a productive cough, congestion, sore throat and headaches in that same time.   On arrival, the patient was afebrile, not tachycardic or tachypneic, hypertensive BP 164/83, saturating 99% on room air.  He endorsed mild shortness of breath, episodes of blood-tinged sputum noted starting yesterday.  He denies any active chest pain.  Endorses mild pleuritic discomfort with coughing.  He has had a sore throat and headaches as well.    Differential diagnosis includes bronchitis, viral upper respiratory infection, influenza, COVID-19, PE, lung mass.  Initial chest x-ray revealed: IMPRESSION:  Irregular suprahilar opacity of the right lung, suspicious for  airspace disease, mass, or fibrosis. Recommend CT to further  evaluate in the setting of hemoptysis.    CBC revealed no leukocytosis or anemia, D-dimer was elevated to 0.78, BMP was unremarkable, COVID-19, influenza, RSV PCR testing was collected and resulted positive for influenza A.  Of the elevated D-dimer and hemoptysis, with associated concern for possible suprahilar opacity of the right lung, CT angiogram was performed: IMPRESSION:  1. Technically adequate exam showing no acute pulmonary embolus.  2. Enlarged RIGHT lobe of the thyroid, measuring at least 5.6  centimeters in diameter. Recommend non-emergent thyroid ultrasound.  Reference: J Am Coll Radiol. 2015 Feb;12(2): 143-50  3. Coronary artery calcifications.  4. Volume loss with atelectasis or scarring in the apical segment of  the RIGHT UPPER lobe.  5.  Aortic Atherosclerosis (ICD10-I70.0).    The patient states that his thyroid goiter has been worked up outpatient.  He is try to get  in with an endocrinologist and his PCP is coordinating this.  His symptoms are likely consistent with acute bronchitis, mild COPD exacerbation in the setting of influenza A infection.  Informed the patient of his radiographic diagnostic results.  Will treat with a course of steroids, albuterol, Tessalon, Tamiflu.  Appearing on repeat assessment, vitally stable, overall stable for discharge at this time.   Final Clinical Impression(s) / ED Diagnoses Final diagnoses:  Influenza A  Acute bronchitis due to other specified organisms  COPD exacerbation ([place])  Hemoptysis    Rx / DC Orders ED Discharge Orders          Ordered    methylPREDNISolone (MEDROL DOSEPAK) 4 MG TBPK tablet  Status:  Discontinued  06/19/22 1407    albuterol (VENTOLIN HFA) 108 (90 Base) MCG/ACT inhaler  Every 6 hours PRN,   Status:  Discontinued        06/19/22 1407    oseltamivir (TAMIFLU) 75 MG capsule  Every 12 hours,   Status:  Discontinued        06/19/22 1407    benzonatate (TESSALON) 100 MG capsule  Every 8 hours,   Status:  Discontinued        06/19/22 1407    albuterol (VENTOLIN HFA) 108 (90 Base) MCG/ACT inhaler  Every 6 hours PRN        06/19/22 1439    benzonatate (TESSALON) 100 MG capsule  Every 8 hours        06/19/22 1439    methylPREDNISolone (MEDROL DOSEPAK) 4 MG TBPK tablet        06/19/22 1439    oseltamivir (TAMIFLU) 75 MG capsule  Every 12 hours        06/19/22 1439              Regan Lemming, MD 06/19/22 1542

## 2022-06-19 NOTE — ED Triage Notes (Signed)
Pt arrives to ED with c/o hemoptysis that started yesterday. He notes chest congestion, sore throat, headache.

## 2022-06-19 NOTE — Telephone Encounter (Signed)
Error

## 2022-06-19 NOTE — Telephone Encounter (Addendum)
Pt was called and told to report to the ed. Pt was coughing up blood on yesterday and Pecolia Ades, FNP spoke with him directly and told him he need a higher level of care. She also stated it sounded like he had shortness of breath during the phone call.

## 2022-06-19 NOTE — Discharge Instructions (Addendum)
Please follow-up with your PCP regarding your goiter.  Your symptoms are likely due to viral influenza and subsequent bronchitis with mild COPD exacerbation.  Your hemoptysis is mild.  Your CT chest revealed no evidence of blood clot and no significant lung mass as had been the concern on your x-ray imaging.  Take Tylenol and ibuprofen for muscle aches and fever, utilize your rescue albuterol inhaler at home, Tessalon for cough suppression, Medrol Dosepak for mild COPD exacerbation, take Tamiflu as well.  Your full CT imaging is as follows: IMPRESSION:  1. Technically adequate exam showing no acute pulmonary embolus.  2. Enlarged RIGHT lobe of the thyroid, measuring at least 5.6  centimeters in diameter. Recommend non-emergent thyroid ultrasound.  Reference: J Am Coll Radiol. 2015 Feb;12(2): 143-50  3. Coronary artery calcifications.  4. Volume loss with atelectasis or scarring in the apical segment of  the RIGHT UPPER lobe.  5.  Aortic Atherosclerosis (ICD10-I70.0).

## 2022-06-19 NOTE — ED Notes (Signed)
Discharge instructions, medications and follow up care reviewed and explained. Pt verbalized understanding and had no further questions.

## 2022-07-19 ENCOUNTER — Encounter (HOSPITAL_BASED_OUTPATIENT_CLINIC_OR_DEPARTMENT_OTHER): Payer: Self-pay

## 2022-08-15 ENCOUNTER — Other Ambulatory Visit: Payer: Self-pay

## 2022-08-15 ENCOUNTER — Other Ambulatory Visit (HOSPITAL_BASED_OUTPATIENT_CLINIC_OR_DEPARTMENT_OTHER): Payer: Self-pay | Admitting: Family Medicine

## 2022-08-15 MED ORDER — LISINOPRIL 40 MG PO TABS
40.0000 mg | ORAL_TABLET | Freq: Every day | ORAL | 2 refills | Status: DC
  Filled 2022-08-15: qty 90, 90d supply, fill #0

## 2022-08-24 NOTE — Telephone Encounter (Signed)
Pt is calling he needs a refill on  Crestor Lisinopril Amlodipine  Trilegy Inhaler   Sent to [facility] in [place]

## 2022-08-25 ENCOUNTER — Other Ambulatory Visit (HOSPITAL_BASED_OUTPATIENT_CLINIC_OR_DEPARTMENT_OTHER): Payer: Self-pay

## 2022-08-25 MED ORDER — AMLODIPINE BESYLATE 10 MG PO TABS: 10.0000 mg | ORAL_TABLET | Freq: Every day | ORAL | 1 refills | Status: DC

## 2022-08-25 MED ORDER — ROSUVASTATIN CALCIUM 20 MG PO TABS: 20.0000 mg | ORAL_TABLET | Freq: Every day | ORAL | 1 refills | Status: DC

## 2022-08-25 MED ORDER — LISINOPRIL 40 MG PO TABS: 40.0000 mg | ORAL_TABLET | Freq: Every day | ORAL | 2 refills | Status: DC

## 2022-09-11 ENCOUNTER — Ambulatory Visit (HOSPITAL_BASED_OUTPATIENT_CLINIC_OR_DEPARTMENT_OTHER): Payer: BC Managed Care – PPO | Admitting: Family Medicine

## 2022-09-18 ENCOUNTER — Ambulatory Visit (HOSPITAL_BASED_OUTPATIENT_CLINIC_OR_DEPARTMENT_OTHER): Payer: BC Managed Care – PPO | Admitting: Family Medicine

## 2022-09-18 VITALS — BP 128/82 | HR 84 | Temp 97.9°F | Ht 69.0 in | Wt 225.0 lb

## 2022-09-18 DIAGNOSIS — R6889 Other general symptoms and signs: Secondary | ICD-10-CM

## 2022-09-18 DIAGNOSIS — H6122 Impacted cerumen, left ear: Secondary | ICD-10-CM

## 2022-09-18 DIAGNOSIS — H6120 Impacted cerumen, unspecified ear: Secondary | ICD-10-CM | POA: Insufficient documentation

## 2022-09-18 DIAGNOSIS — J069 Acute upper respiratory infection, unspecified: Secondary | ICD-10-CM | POA: Diagnosis not present

## 2022-09-18 LAB — POC INFLUENZA A&B (BINAX/QUICKVUE)
Influenza A, POC: NEGATIVE
Influenza B, POC: NEGATIVE

## 2022-09-18 NOTE — Assessment & Plan Note (Signed)
Noted on physical exam today.  Discussed options including lavage in office today versus use of Debrox at home.  Patient elected to proceed with lavage in office.  Successful lavage completed and patient with improvement in symptoms including ear discomfort and itching.  Discussed general recommendations moving forward

## 2022-09-18 NOTE — Assessment & Plan Note (Signed)
Patient reports that beginning about 3 days ago he began to experience sore throat, cough.  Denies any significant myalgias, no fevers, chills, sweats.  Has been utilizing Mucinex.  Somewhat decreased appetite.  Did recently test positive for influenza about 3 months ago. On exam, patient in no acute distress, vital signs stable, patient is afebrile.  Cardiovascular Sam with regular rate and rhythm, lungs clear to auscultation bilaterally.  Normal-appearing right tympanic membrane.  Left auditory canal with notable cerumen.  No significant tenderness to palpation over sinuses. Suspect acute viral infection.  Did discuss consideration of flu swab today, although feel that this is less likely.  Patient elected to proceed with influenza testing, swab ultimately was negative which is reassuring.  Recommend continuing with symptomatic measures including Mucinex, adequate rest and hydration.

## 2022-09-18 NOTE — Progress Notes (Signed)
    Procedures performed today:    None.  Independent interpretation of notes and tests performed by another provider:   None.  Brief History, Exam, Impression, and Recommendations:    BP 128/82 (BP Location: Left Arm, Patient Position: Sitting, Cuff Size: Large)   Pulse 84   Temp 97.9 F (36.6 C) (Oral)   Ht 5\' 9"  (1.753 m)   Wt 225 lb (102.1 kg)   SpO2 97%   BMI 33.23 kg/m   URI (upper respiratory infection) Patient reports that beginning about 3 days ago he began to experience sore throat, cough.  Denies any significant myalgias, no fevers, chills, sweats.  Has been utilizing Mucinex.  Somewhat decreased appetite.  Did recently test positive for influenza about 3 months ago. On exam, patient in no acute distress, vital signs stable, patient is afebrile.  Cardiovascular Sam with regular rate and rhythm, lungs clear to auscultation bilaterally.  Normal-appearing right tympanic membrane.  Left auditory canal with notable cerumen.  No significant tenderness to palpation over sinuses. Suspect acute viral infection.  Did discuss consideration of flu swab today, although feel that this is less likely.  Patient elected to proceed with influenza testing, swab ultimately was negative which is reassuring.  Recommend continuing with symptomatic measures including Mucinex, adequate rest and hydration.  Cerumen impaction Noted on physical exam today.  Discussed options including lavage in office today versus use of Debrox at home.  Patient elected to proceed with lavage in office.  Successful lavage completed and patient with improvement in symptoms including ear discomfort and itching.  Discussed general recommendations moving forward  No follow-ups on file.   ___________________________________________ Grenda Lora de [place], MD, ABFM, CAQSM Primary Care and Sports Medicine [facility]

## 2022-10-04 ENCOUNTER — Ambulatory Visit (HOSPITAL_BASED_OUTPATIENT_CLINIC_OR_DEPARTMENT_OTHER): Payer: BC Managed Care – PPO | Admitting: Family Medicine

## 2022-11-18 IMAGING — US US ABDOMEN LIMITED
1 series · 14 of 25 positions shown · non-contrast
Comparison: No prior.

CLINICAL DATA: Right upper quadrant abdominal pain.

EXAM:
ULTRASOUND ABDOMEN LIMITED RIGHT UPPER QUADRANT

[Series 1: us abdomen limited · 14 of 35 slices shown]
[im 1/35]
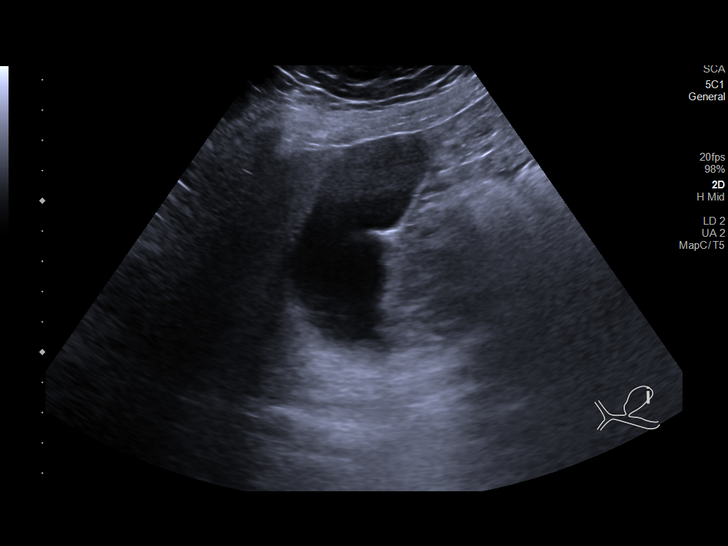
[im 3/35]
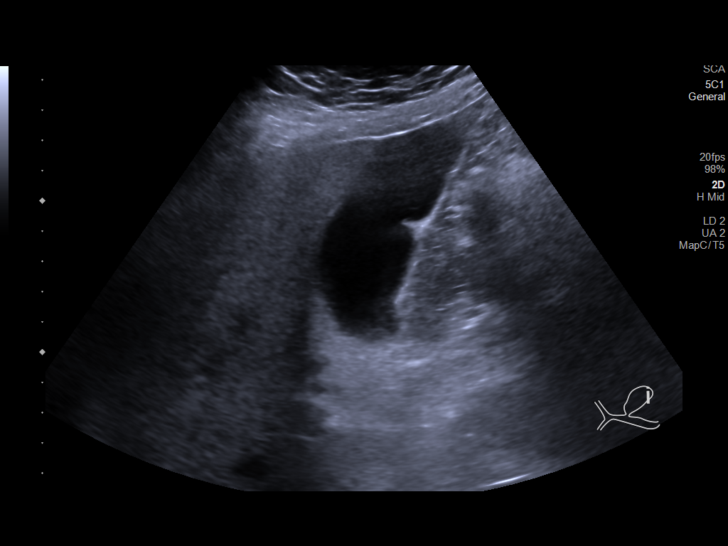
[im 6/35]
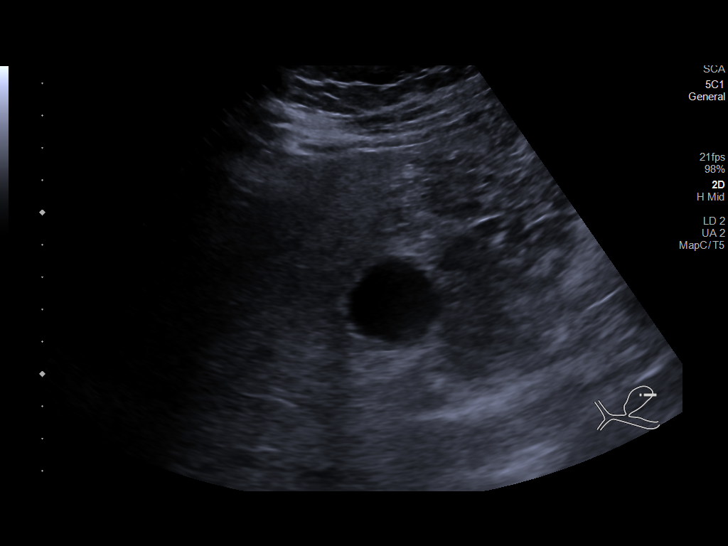
[im 9/35]
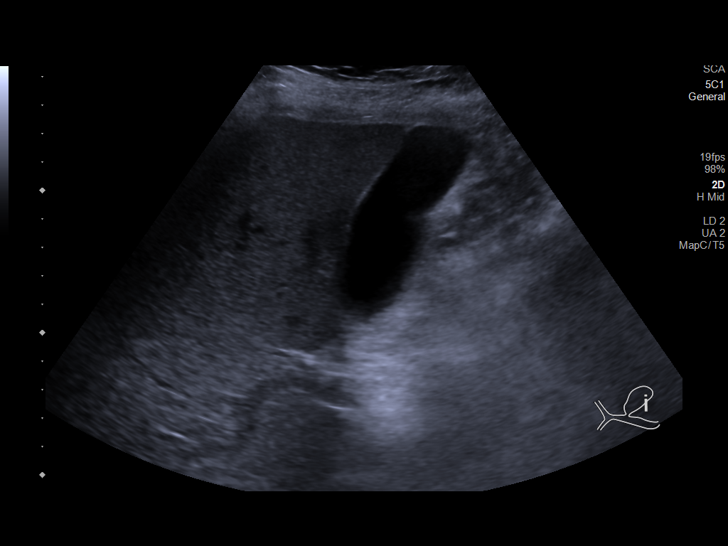
[im 12/35]
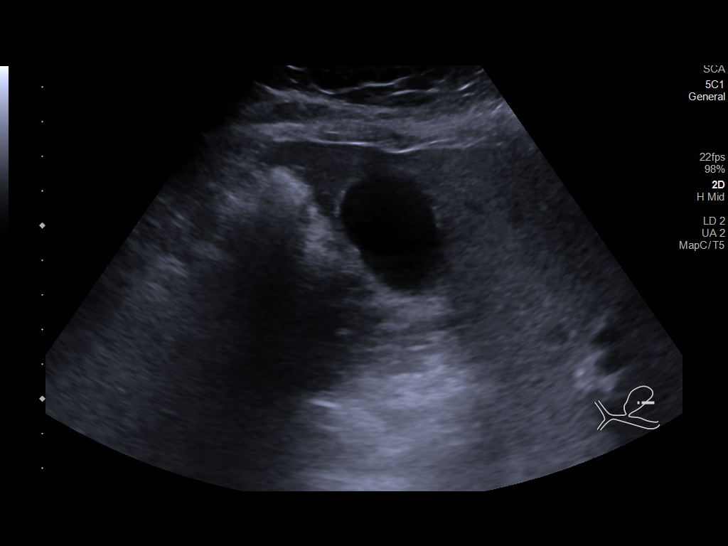
[im 13/35]
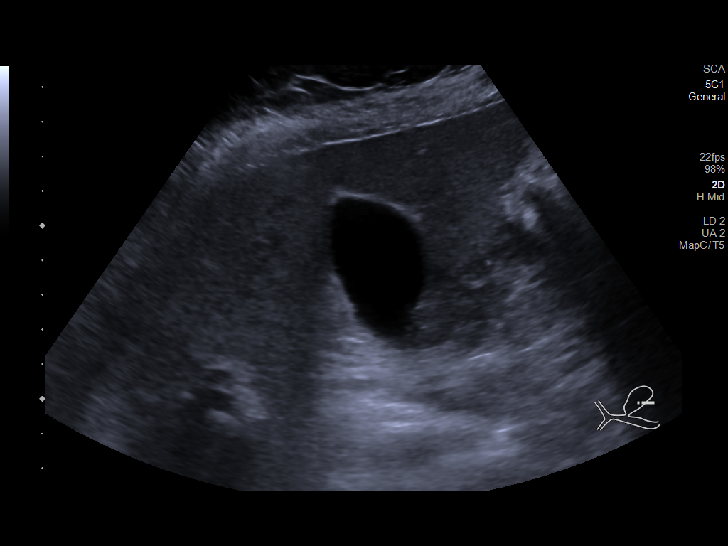
[im 16/35]
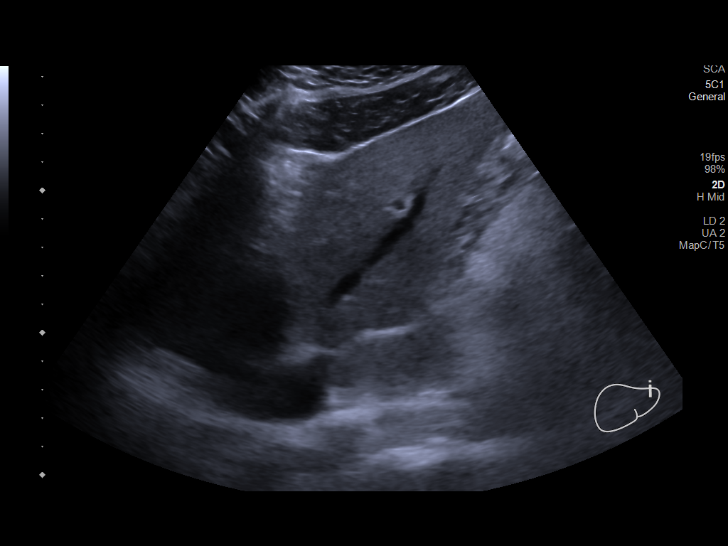
[im 19/35]
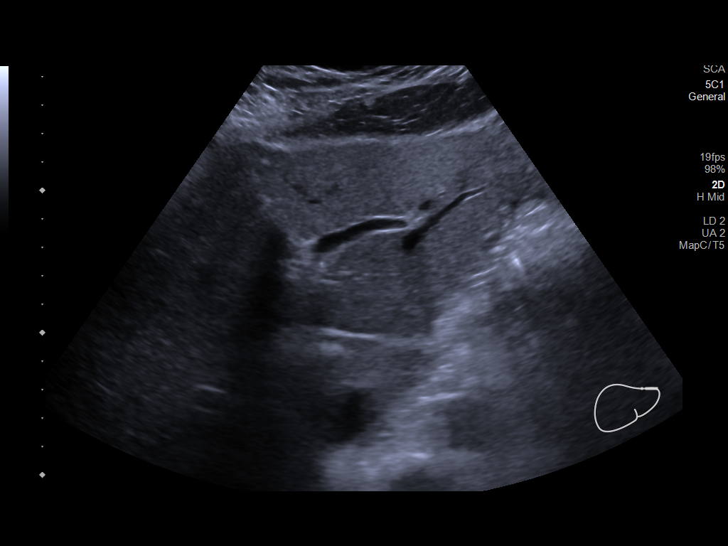
[im 22/35]
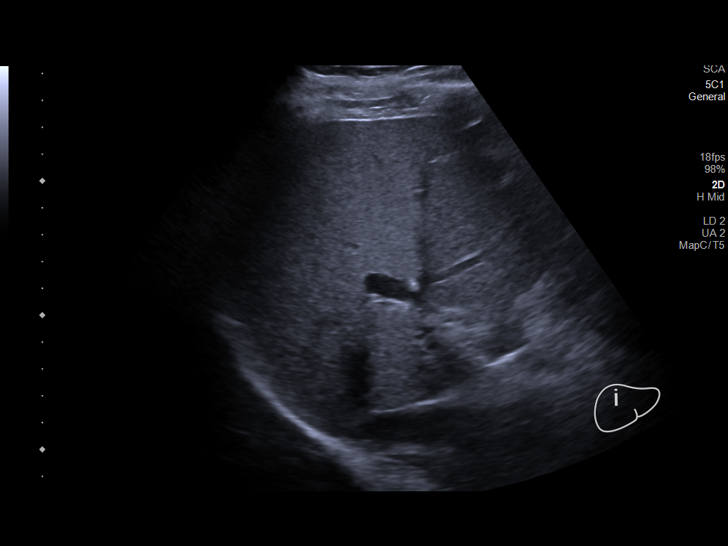
[im 23/35]
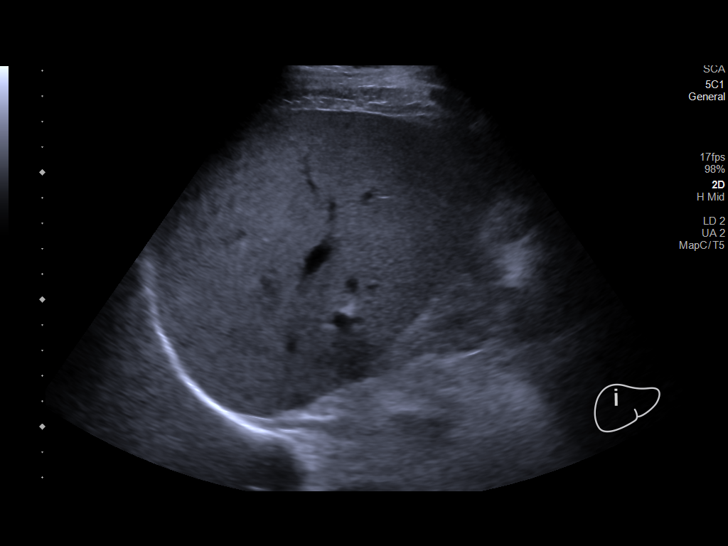
[im 26/35]
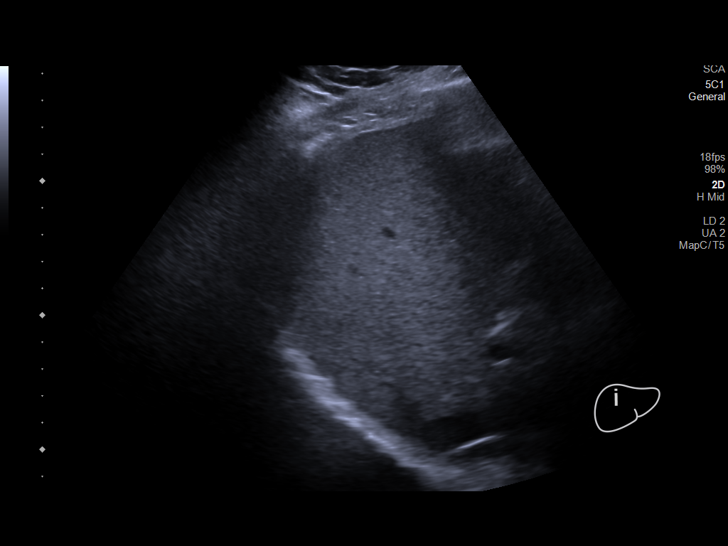
[im 29/35]
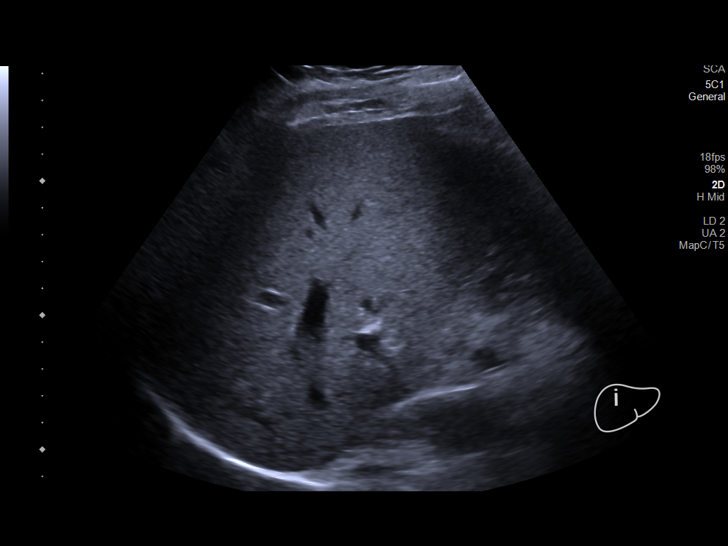
[im 32/35]
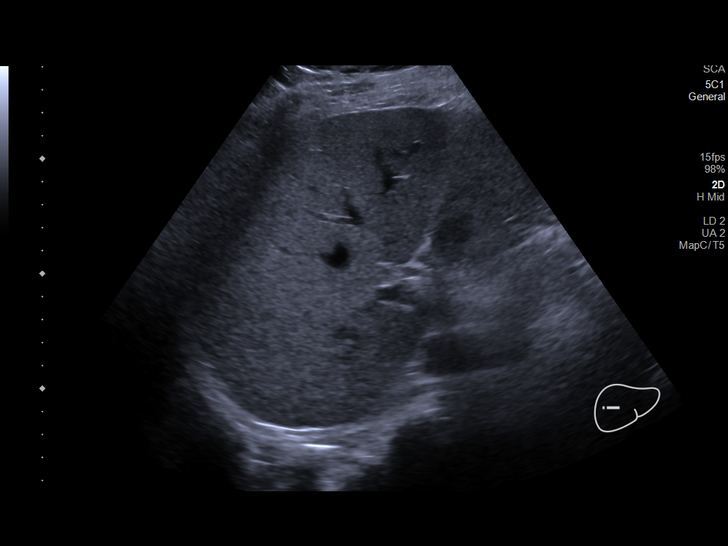
[im 35/35]
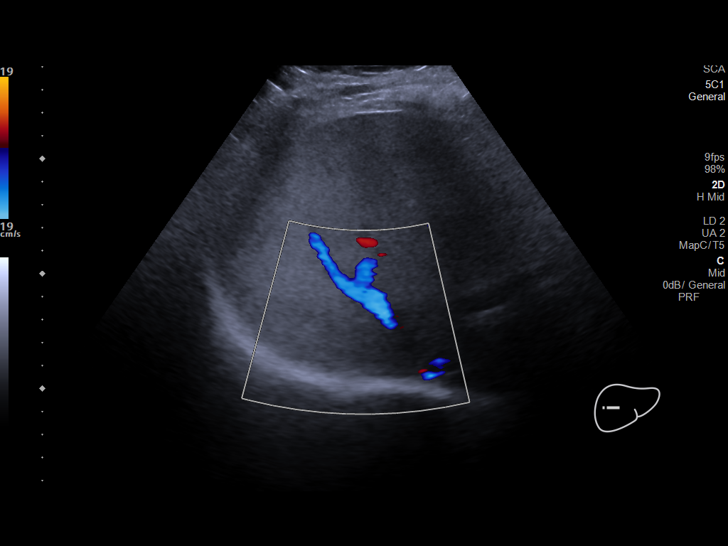

[14 of 25 positions shown; findings below may reference images not displayed]

FINDINGS: Gallbladder:

No gallstones or wall thickening visualized. No sonographic Murphy
sign noted by sonographer.

Common bile duct:

Diameter: 3.3 mm

Liver:

Increased hepatic echogenicity consistent fatty infiltration or
hepatocellular disease. No focal hepatic abnormality identified.
Portal vein is patent on color Doppler imaging with normal direction
of blood flow towards the liver.

Other: None.
IMPRESSION: 1.  No gallstones or biliary distention.

2. Increased hepatic echogenicity consistent fatty infiltration or
hepatocellular disease. No focal hepatic abnormality identified.

## 2022-11-23 ENCOUNTER — Encounter (HOSPITAL_BASED_OUTPATIENT_CLINIC_OR_DEPARTMENT_OTHER): Payer: Self-pay | Admitting: *Deleted

## 2022-12-04 ENCOUNTER — Other Ambulatory Visit (HOSPITAL_BASED_OUTPATIENT_CLINIC_OR_DEPARTMENT_OTHER): Payer: Self-pay

## 2022-12-04 ENCOUNTER — Encounter (HOSPITAL_BASED_OUTPATIENT_CLINIC_OR_DEPARTMENT_OTHER): Payer: Self-pay | Admitting: Family Medicine

## 2022-12-04 MED ORDER — PANTOPRAZOLE SODIUM 40 MG PO TBEC: 40.0000 mg | DELAYED_RELEASE_TABLET | Freq: Every day | ORAL | 2 refills | Status: DC

## 2022-12-04 MED ORDER — METOPROLOL TARTRATE 25 MG PO TABS: 25.0000 mg | ORAL_TABLET | Freq: Two times a day (BID) | ORAL | 2 refills | Status: DC

## 2022-12-28 ENCOUNTER — Ambulatory Visit (HOSPITAL_BASED_OUTPATIENT_CLINIC_OR_DEPARTMENT_OTHER): Payer: BC Managed Care – PPO | Admitting: Family Medicine

## 2022-12-28 ENCOUNTER — Ambulatory Visit (HOSPITAL_BASED_OUTPATIENT_CLINIC_OR_DEPARTMENT_OTHER): Payer: BC Managed Care – PPO

## 2022-12-28 ENCOUNTER — Encounter (HOSPITAL_BASED_OUTPATIENT_CLINIC_OR_DEPARTMENT_OTHER): Payer: Self-pay | Admitting: Family Medicine

## 2022-12-28 VITALS — BP 144/92 | HR 84 | Ht 69.02 in | Wt 222.1 lb

## 2022-12-28 DIAGNOSIS — M79673 Pain in unspecified foot: Secondary | ICD-10-CM | POA: Insufficient documentation

## 2022-12-28 DIAGNOSIS — M79672 Pain in left foot: Secondary | ICD-10-CM | POA: Diagnosis not present

## 2022-12-28 DIAGNOSIS — J069 Acute upper respiratory infection, unspecified: Secondary | ICD-10-CM | POA: Diagnosis not present

## 2022-12-28 DIAGNOSIS — M19072 Primary osteoarthritis, left ankle and foot: Secondary | ICD-10-CM | POA: Diagnosis not present

## 2022-12-28 NOTE — Progress Notes (Signed)
    Procedures performed today:    None.  Independent interpretation of notes and tests performed by another provider:   None.  Brief History, Exam, Impression, and Recommendations:    BP (!) 144/92   Pulse 84   Ht 5' 9.02" (1.753 m)   Wt 222 lb 1.6 oz (100.7 kg)   SpO2 98%   BMI 32.78 kg/m   Left foot pain Assessment & Plan: Patient reports that he has left foot pain started low but over the 1 week ago.  He initially stubbed his middle toe and had some pain as well as discoloration under the nail following that.  About 1 week later, he noticed some pain in forefoot which was worse with standing, walking.  Pain was exacerbated recently by being at amusement park in [place] and doing a lot of walking at that time.  This was over the most recent weekend.  He has been utilizing Tylenol to try and help out with symptoms.  Denies any prior issues with foot in the past. On exam, no bruising or other skin changes noted over foot.  There is some discoloration underneath nail of third toe.  Mild swelling appreciated in distal foot near metatarsal heads, primarily centrally and laterally through foot.  He does have tenderness to palpation along distal aspect of second third and fourth metatarsals.  No abnormalities noted along underside of foot.  No tenderness to palpation through toes.  Distal neurovascular exam is intact. Do not feel that trauma to toe is necessarily associated with presenting symptoms.  Given bony tenderness, feel would be reasonable to proceed with x-rays at this time to assess for any underlying bony abnormality such as fracture, stress reaction.  Patient will look to get these done today.  If imaging is unremarkable, could consider further evaluation with podiatry  Orders: -     DG Foot Complete Left; Future  Upper respiratory tract infection, unspecified type Assessment & Plan: Patient reports that earlier this week he began to have symptoms including rhinorrhea, sweats,  fatigue, cough, headache.  He has been utilizing Tylenol to help with foot pain as above ED feels that this is been helping out with headache, body aches, any underlying fever.  He has also been utilizing Mucinex.  Cough is present, however not significantly impacting day-to-day activities. On exam, patient is in no acute distress, vital signs stable.  Patient is afebrile in office today.  No obvious dyspnea, able to speak in complete sentences, no increased work of breathing. Did complete in office COVID test which was negative Suspect underlying viral cause for symptoms.  Can continue with conservative measures to help with symptom control, reviewed recommendations for OTC measures. Would expect symptom improvement over about 5 to 7 days from symptom onset.  If symptoms do persist, can return to office for further evaluation, particularly if any worsening does occur.  It is possible that cough may linger for up to 4 to 6 weeks after illness which would not be unexpected   Return if symptoms worsen or fail to improve.   ___________________________________________ Idelia Caudell de [place], MD, ABFM, [facility] Primary Care and Sports Medicine [facility]

## 2022-12-28 NOTE — Assessment & Plan Note (Signed)
Patient reports that he has left foot pain started low but over the 1 week ago.  He initially stubbed his middle toe and had some pain as well as discoloration under the nail following that.  About 1 week later, he noticed some pain in forefoot which was worse with standing, walking.  Pain was exacerbated recently by being at amusement park in [place] and doing a lot of walking at that time.  This was over the most recent weekend.  He has been utilizing Tylenol to try and help out with symptoms.  Denies any prior issues with foot in the past. On exam, no bruising or other skin changes noted over foot.  There is some discoloration underneath nail of third toe.  Mild swelling appreciated in distal foot near metatarsal heads, primarily centrally and laterally through foot.  He does have tenderness to palpation along distal aspect of second third and fourth metatarsals.  No abnormalities noted along underside of foot.  No tenderness to palpation through toes.  Distal neurovascular exam is intact. Do not feel that trauma to toe is necessarily associated with presenting symptoms.  Given bony tenderness, feel would be reasonable to proceed with x-rays at this time to assess for any underlying bony abnormality such as fracture, stress reaction.  Patient will look to get these done today.  If imaging is unremarkable, could consider further evaluation with podiatry

## 2022-12-28 NOTE — Assessment & Plan Note (Addendum)
Patient reports that earlier this week he began to have symptoms including rhinorrhea, sweats, fatigue, cough, headache.  He has been utilizing Tylenol to help with foot pain as above ED feels that this is been helping out with headache, body aches, any underlying fever.  He has also been utilizing Mucinex.  Cough is present, however not significantly impacting day-to-day activities. On exam, patient is in no acute distress, vital signs stable.  Patient is afebrile in office today.  No obvious dyspnea, able to speak in complete sentences, no increased work of breathing. Did complete in office COVID test which was negative Suspect underlying viral cause for symptoms.  Can continue with conservative measures to help with symptom control, reviewed recommendations for OTC measures. Would expect symptom improvement over about 5 to 7 days from symptom onset.  If symptoms do persist, can return to office for further evaluation, particularly if any worsening does occur.  It is possible that cough may linger for up to 4 to 6 weeks after illness which would not be unexpected

## 2023-01-01 DIAGNOSIS — L03116 Cellulitis of left lower limb: Secondary | ICD-10-CM | POA: Diagnosis not present

## 2023-01-01 DIAGNOSIS — S9782XA Crushing injury of left foot, initial encounter: Secondary | ICD-10-CM | POA: Diagnosis not present

## 2023-01-01 DIAGNOSIS — X58XXXA Exposure to other specified factors, initial encounter: Secondary | ICD-10-CM | POA: Diagnosis not present

## 2023-02-16 ENCOUNTER — Other Ambulatory Visit (HOSPITAL_BASED_OUTPATIENT_CLINIC_OR_DEPARTMENT_OTHER): Payer: Self-pay | Admitting: *Deleted

## 2023-02-16 MED ORDER — ROSUVASTATIN CALCIUM 20 MG PO TABS: 20.0000 mg | ORAL_TABLET | Freq: Every day | ORAL | 1 refills | Status: DC

## 2023-02-16 MED ORDER — AMLODIPINE BESYLATE 10 MG PO TABS: 10.0000 mg | ORAL_TABLET | Freq: Every day | ORAL | 1 refills | Status: DC

## 2023-02-28 DIAGNOSIS — M9901 Segmental and somatic dysfunction of cervical region: Secondary | ICD-10-CM | POA: Diagnosis not present

## 2023-02-28 DIAGNOSIS — M9905 Segmental and somatic dysfunction of pelvic region: Secondary | ICD-10-CM | POA: Diagnosis not present

## 2023-02-28 DIAGNOSIS — M9902 Segmental and somatic dysfunction of thoracic region: Secondary | ICD-10-CM | POA: Diagnosis not present

## 2023-02-28 DIAGNOSIS — M9903 Segmental and somatic dysfunction of lumbar region: Secondary | ICD-10-CM | POA: Diagnosis not present

## 2023-03-01 DIAGNOSIS — M9903 Segmental and somatic dysfunction of lumbar region: Secondary | ICD-10-CM | POA: Diagnosis not present

## 2023-03-01 DIAGNOSIS — M9901 Segmental and somatic dysfunction of cervical region: Secondary | ICD-10-CM | POA: Diagnosis not present

## 2023-03-01 DIAGNOSIS — M9905 Segmental and somatic dysfunction of pelvic region: Secondary | ICD-10-CM | POA: Diagnosis not present

## 2023-03-01 DIAGNOSIS — M9902 Segmental and somatic dysfunction of thoracic region: Secondary | ICD-10-CM | POA: Diagnosis not present

## 2023-03-02 DIAGNOSIS — M9901 Segmental and somatic dysfunction of cervical region: Secondary | ICD-10-CM | POA: Diagnosis not present

## 2023-03-02 DIAGNOSIS — M9902 Segmental and somatic dysfunction of thoracic region: Secondary | ICD-10-CM | POA: Diagnosis not present

## 2023-03-02 DIAGNOSIS — M9905 Segmental and somatic dysfunction of pelvic region: Secondary | ICD-10-CM | POA: Diagnosis not present

## 2023-03-02 DIAGNOSIS — M9903 Segmental and somatic dysfunction of lumbar region: Secondary | ICD-10-CM | POA: Diagnosis not present

## 2023-03-05 ENCOUNTER — Other Ambulatory Visit (HOSPITAL_BASED_OUTPATIENT_CLINIC_OR_DEPARTMENT_OTHER): Payer: Self-pay | Admitting: *Deleted

## 2023-03-05 DIAGNOSIS — M9903 Segmental and somatic dysfunction of lumbar region: Secondary | ICD-10-CM | POA: Diagnosis not present

## 2023-03-05 DIAGNOSIS — M9902 Segmental and somatic dysfunction of thoracic region: Secondary | ICD-10-CM | POA: Diagnosis not present

## 2023-03-05 DIAGNOSIS — M9901 Segmental and somatic dysfunction of cervical region: Secondary | ICD-10-CM | POA: Diagnosis not present

## 2023-03-05 DIAGNOSIS — M9905 Segmental and somatic dysfunction of pelvic region: Secondary | ICD-10-CM | POA: Diagnosis not present

## 2023-03-05 MED ORDER — METOPROLOL TARTRATE 25 MG PO TABS: 25.0000 mg | ORAL_TABLET | Freq: Two times a day (BID) | ORAL | 2 refills | Status: DC

## 2023-03-05 MED ORDER — PANTOPRAZOLE SODIUM 40 MG PO TBEC: 40.0000 mg | DELAYED_RELEASE_TABLET | Freq: Every day | ORAL | 2 refills | Status: DC

## 2023-03-07 DIAGNOSIS — M9905 Segmental and somatic dysfunction of pelvic region: Secondary | ICD-10-CM | POA: Diagnosis not present

## 2023-03-07 DIAGNOSIS — M9901 Segmental and somatic dysfunction of cervical region: Secondary | ICD-10-CM | POA: Diagnosis not present

## 2023-03-07 DIAGNOSIS — M9902 Segmental and somatic dysfunction of thoracic region: Secondary | ICD-10-CM | POA: Diagnosis not present

## 2023-03-07 DIAGNOSIS — M9903 Segmental and somatic dysfunction of lumbar region: Secondary | ICD-10-CM | POA: Diagnosis not present

## 2023-03-09 DIAGNOSIS — M9905 Segmental and somatic dysfunction of pelvic region: Secondary | ICD-10-CM | POA: Diagnosis not present

## 2023-03-09 DIAGNOSIS — M9903 Segmental and somatic dysfunction of lumbar region: Secondary | ICD-10-CM | POA: Diagnosis not present

## 2023-03-09 DIAGNOSIS — M9901 Segmental and somatic dysfunction of cervical region: Secondary | ICD-10-CM | POA: Diagnosis not present

## 2023-03-09 DIAGNOSIS — M9902 Segmental and somatic dysfunction of thoracic region: Secondary | ICD-10-CM | POA: Diagnosis not present

## 2023-03-12 DIAGNOSIS — M9901 Segmental and somatic dysfunction of cervical region: Secondary | ICD-10-CM | POA: Diagnosis not present

## 2023-03-12 DIAGNOSIS — M9905 Segmental and somatic dysfunction of pelvic region: Secondary | ICD-10-CM | POA: Diagnosis not present

## 2023-03-12 DIAGNOSIS — M9903 Segmental and somatic dysfunction of lumbar region: Secondary | ICD-10-CM | POA: Diagnosis not present

## 2023-03-12 DIAGNOSIS — M9902 Segmental and somatic dysfunction of thoracic region: Secondary | ICD-10-CM | POA: Diagnosis not present

## 2023-03-13 DIAGNOSIS — M9905 Segmental and somatic dysfunction of pelvic region: Secondary | ICD-10-CM | POA: Diagnosis not present

## 2023-03-13 DIAGNOSIS — M9901 Segmental and somatic dysfunction of cervical region: Secondary | ICD-10-CM | POA: Diagnosis not present

## 2023-03-13 DIAGNOSIS — M9902 Segmental and somatic dysfunction of thoracic region: Secondary | ICD-10-CM | POA: Diagnosis not present

## 2023-03-13 DIAGNOSIS — M9903 Segmental and somatic dysfunction of lumbar region: Secondary | ICD-10-CM | POA: Diagnosis not present

## 2023-03-14 DIAGNOSIS — M9903 Segmental and somatic dysfunction of lumbar region: Secondary | ICD-10-CM | POA: Diagnosis not present

## 2023-03-14 DIAGNOSIS — M9905 Segmental and somatic dysfunction of pelvic region: Secondary | ICD-10-CM | POA: Diagnosis not present

## 2023-03-14 DIAGNOSIS — M9901 Segmental and somatic dysfunction of cervical region: Secondary | ICD-10-CM | POA: Diagnosis not present

## 2023-03-14 DIAGNOSIS — M9902 Segmental and somatic dysfunction of thoracic region: Secondary | ICD-10-CM | POA: Diagnosis not present

## 2023-03-26 DIAGNOSIS — M9901 Segmental and somatic dysfunction of cervical region: Secondary | ICD-10-CM | POA: Diagnosis not present

## 2023-03-26 DIAGNOSIS — M9903 Segmental and somatic dysfunction of lumbar region: Secondary | ICD-10-CM | POA: Diagnosis not present

## 2023-03-26 DIAGNOSIS — M9905 Segmental and somatic dysfunction of pelvic region: Secondary | ICD-10-CM | POA: Diagnosis not present

## 2023-03-26 DIAGNOSIS — M9902 Segmental and somatic dysfunction of thoracic region: Secondary | ICD-10-CM | POA: Diagnosis not present

## 2023-03-28 ENCOUNTER — Encounter (HOSPITAL_BASED_OUTPATIENT_CLINIC_OR_DEPARTMENT_OTHER): Payer: Self-pay | Admitting: *Deleted

## 2023-03-28 ENCOUNTER — Encounter (HOSPITAL_BASED_OUTPATIENT_CLINIC_OR_DEPARTMENT_OTHER): Payer: Self-pay | Admitting: Family Medicine

## 2023-03-28 ENCOUNTER — Ambulatory Visit (INDEPENDENT_AMBULATORY_CARE_PROVIDER_SITE_OTHER): Payer: BC Managed Care – PPO | Admitting: Family Medicine

## 2023-03-28 VITALS — BP 144/85 | HR 78 | Ht 69.0 in | Wt 225.0 lb

## 2023-03-28 DIAGNOSIS — M9902 Segmental and somatic dysfunction of thoracic region: Secondary | ICD-10-CM | POA: Diagnosis not present

## 2023-03-28 DIAGNOSIS — M9901 Segmental and somatic dysfunction of cervical region: Secondary | ICD-10-CM | POA: Diagnosis not present

## 2023-03-28 DIAGNOSIS — Z0000 Encounter for general adult medical examination without abnormal findings: Secondary | ICD-10-CM

## 2023-03-28 DIAGNOSIS — M9905 Segmental and somatic dysfunction of pelvic region: Secondary | ICD-10-CM | POA: Diagnosis not present

## 2023-03-28 DIAGNOSIS — E049 Nontoxic goiter, unspecified: Secondary | ICD-10-CM | POA: Diagnosis not present

## 2023-03-28 DIAGNOSIS — M9903 Segmental and somatic dysfunction of lumbar region: Secondary | ICD-10-CM | POA: Diagnosis not present

## 2023-03-28 MED ORDER — TRELEGY ELLIPTA 100-62.5-25 MCG/ACT IN AEPB: 1.0000 | INHALATION_SPRAY | Freq: Every day | RESPIRATORY_TRACT | 3 refills | Status: DC

## 2023-03-28 NOTE — Assessment & Plan Note (Signed)
Routine HCM labs ordered. HCM reviewed/discussed. Anticipatory guidance regarding healthy weight, lifestyle and choices given. Recommend healthy diet.  Recommend approximately 150 minutes/week of moderate intensity exercise Recommend regular dental and vision exams Always use seatbelt/lap and shoulder restraints Recommend using smoke alarms and checking batteries at least twice a year Recommend using sunscreen when outside Discussed colon cancer screening recommendations, options.  Patient has had colonoscopy with [facility] in the recent past - thinks about 1-2 years ago Discussed recommendations for shingles vaccine.  Patient declines at this time Discussed tetanus immunization recommendations, patient is due but declines Recommend season flu vaccine, patient declines today

## 2023-03-28 NOTE — Progress Notes (Signed)
Subjective:    CC: Annual Physical Exam  HPI:  Kyle Mcdonald. is a 57 y.o. presenting for annual physical  I reviewed the past medical history, family history, social history, surgical history, and allergies today and no changes were needed.  Please see the problem list section below in epic for further details.  Past Medical History: History reviewed. No pertinent past medical history. Past Surgical History: History reviewed. No pertinent surgical history. Social History: Social History   Socioeconomic History   Marital status: Married    Spouse name: Claris Che   Number of children: 2   Years of education: Not on file   Highest education level: Not on file  Occupational History   Not on file  Tobacco Use   Smoking status: Former    Current packs/day: 0.00    Types: Cigarettes    Quit date: 2012    Years since quitting: 12.8    Passive exposure: Past   Smokeless tobacco: Never  Vaping Use   Vaping status: Never Used  Substance and Sexual Activity   Alcohol use: Yes    Comment: couple beers a day   Drug use: Never   Sexual activity: Yes    Birth control/protection: None  Other Topics Concern   Not on file  Social History Narrative   Not on file   Social Determinants of Health   Financial Resource Strain: Low Risk  (10/02/2019)   Received from [facility], [facility]   Overall Financial Resource Strain (CARDIA)    Difficulty of Paying Living Expenses: Not hard at all  Food Insecurity: No Food Insecurity (10/02/2019)   Received from [facility], [facility]   Hunger Vital Sign    Worried About Running Out of Food in the Last Year: Never true    Ran Out of Food in the Last Year: Never true  Transportation Needs: No Transportation Needs (10/02/2019)   Received from [facility], [facility]   PRAPARE - Transportation    Lack of Transportation (Medical): No    Lack of Transportation (Non-Medical): No  Physical Activity: Inactive (10/02/2019)    Received from [facility], [facility]   Exercise Vital Sign    Days of Exercise per Week: 0 days    Minutes of Exercise per Session: 0 min  Stress: No Stress Concern Present (10/02/2019)   Received from [facility], [facility] - Occupational Stress Questionnaire    Feeling of Stress : Not at all  Social Connections: Unknown (01/01/2023)   Received from [facility]   Social Network    Social Network: Not on file   Family History: History reviewed. No pertinent family history. Allergies: No Known Allergies Medications: See med rec.  Review of Systems: No headache, visual changes, nausea, vomiting, diarrhea, constipation, dizziness, abdominal pain, skin rash, fevers, chills, night sweats, swollen lymph nodes, weight loss, chest pain, body aches, joint swelling, muscle aches, shortness of breath, mood changes, visual or auditory hallucinations.  Objective:    BP (!) 144/85 (BP Location: Right Arm, Patient Position: Sitting, Cuff Size: Normal)   Pulse 78   Ht 5\' 9"  (1.753 m)   Wt 225 lb (102.1 kg)   SpO2 98%   BMI 33.23 kg/m   General: Well Developed, well nourished, and in no acute distress. Neuro: Alert and oriented x3, extra-ocular muscles intact, sensation grossly intact. Cranial nerves II through XII are intact, motor, sensory, and coordinative functions are all intact. HEENT: Normocephalic,  atraumatic, pupils equal round reactive to light, neck supple, no masses, no lymphadenopathy, thyroid notably enlarged. Oropharynx, nasopharynx, external ear canals are unremarkable. Skin: Warm and dry, no rashes noted. Cardiac: Regular rate and rhythm, no murmurs rubs or gallops. Respiratory: Clear to auscultation bilaterally. Not using accessory muscles, speaking in full sentences. Abdominal: Soft, nontender, nondistended, positive bowel sounds, no masses, no organomegaly. Musculoskeletal: Shoulder, elbow, wrist, hip, knee, ankle stable,  and with full range of motion.  Impression and Recommendations:    Wellness examination Assessment & Plan: Routine HCM labs ordered. HCM reviewed/discussed. Anticipatory guidance regarding healthy weight, lifestyle and choices given. Recommend healthy diet.  Recommend approximately 150 minutes/week of moderate intensity exercise Recommend regular dental and vision exams Always use seatbelt/lap and shoulder restraints Recommend using smoke alarms and checking batteries at least twice a year Recommend using sunscreen when outside Discussed colon cancer screening recommendations, options.  Patient has had colonoscopy with [facility] in the recent past - thinks about 1-2 years ago Discussed recommendations for shingles vaccine.  Patient declines at this time Discussed tetanus immunization recommendations, patient is due but declines Recommend season flu vaccine, patient declines today  Orders: -     CBC with Differential/Platelet; Future -     Comprehensive metabolic panel; Future -     Hemoglobin A1c; Future -     Lipid panel; Future -     TSH Rfx on Abnormal to Free T4; Future  Goiter Assessment & Plan: Patient denies any new issues such as voice changes, trouble swallowing, breathing, issues with pain. As before, patient advised to have evaluation with endocrinologist, still has not arranged. Referral has been placed previously, patient provided with contact information for office today. Advised that if wait time for appointment with endocrinology is somewhat long, recommend reaching out to [place] and we can look into alternative referral options   Other orders -     Trelegy Ellipta; Inhale 1 Inhalation into the lungs daily.  Dispense: 90 each; Refill: 3  Return in about 4 months (around 07/26/2023) for hypertension, goiter.   ___________________________________________ Antonya Leeder de [place], MD, ABFM, CAQSM Primary Care and Sports Medicine [facility]

## 2023-03-28 NOTE — Assessment & Plan Note (Signed)
Patient denies any new issues such as voice changes, trouble swallowing, breathing, issues with pain. As before, patient advised to have evaluation with endocrinologist, still has not arranged. Referral has been placed previously, patient provided with contact information for office today. Advised that if wait time for appointment with endocrinology is somewhat long, recommend reaching out to [place] and we can look into alternative referral options

## 2023-03-30 DIAGNOSIS — M9901 Segmental and somatic dysfunction of cervical region: Secondary | ICD-10-CM | POA: Diagnosis not present

## 2023-03-30 DIAGNOSIS — M9903 Segmental and somatic dysfunction of lumbar region: Secondary | ICD-10-CM | POA: Diagnosis not present

## 2023-03-30 DIAGNOSIS — M9902 Segmental and somatic dysfunction of thoracic region: Secondary | ICD-10-CM | POA: Diagnosis not present

## 2023-03-30 DIAGNOSIS — M9905 Segmental and somatic dysfunction of pelvic region: Secondary | ICD-10-CM | POA: Diagnosis not present

## 2023-04-02 DIAGNOSIS — M9902 Segmental and somatic dysfunction of thoracic region: Secondary | ICD-10-CM | POA: Diagnosis not present

## 2023-04-02 DIAGNOSIS — M9903 Segmental and somatic dysfunction of lumbar region: Secondary | ICD-10-CM | POA: Diagnosis not present

## 2023-04-02 DIAGNOSIS — M9901 Segmental and somatic dysfunction of cervical region: Secondary | ICD-10-CM | POA: Diagnosis not present

## 2023-04-02 DIAGNOSIS — M9905 Segmental and somatic dysfunction of pelvic region: Secondary | ICD-10-CM | POA: Diagnosis not present

## 2023-04-04 DIAGNOSIS — M9903 Segmental and somatic dysfunction of lumbar region: Secondary | ICD-10-CM | POA: Diagnosis not present

## 2023-04-04 DIAGNOSIS — M9905 Segmental and somatic dysfunction of pelvic region: Secondary | ICD-10-CM | POA: Diagnosis not present

## 2023-04-04 DIAGNOSIS — M9901 Segmental and somatic dysfunction of cervical region: Secondary | ICD-10-CM | POA: Diagnosis not present

## 2023-04-04 DIAGNOSIS — M9902 Segmental and somatic dysfunction of thoracic region: Secondary | ICD-10-CM | POA: Diagnosis not present

## 2023-04-07 DIAGNOSIS — R059 Cough, unspecified: Secondary | ICD-10-CM | POA: Diagnosis not present

## 2023-04-07 DIAGNOSIS — R509 Fever, unspecified: Secondary | ICD-10-CM | POA: Diagnosis not present

## 2023-04-07 DIAGNOSIS — R109 Unspecified abdominal pain: Secondary | ICD-10-CM | POA: Diagnosis not present

## 2023-04-07 DIAGNOSIS — R051 Acute cough: Secondary | ICD-10-CM | POA: Diagnosis not present

## 2023-04-07 DIAGNOSIS — Z20822 Contact with and (suspected) exposure to covid-19: Secondary | ICD-10-CM | POA: Diagnosis not present

## 2023-04-07 DIAGNOSIS — J189 Pneumonia, unspecified organism: Secondary | ICD-10-CM | POA: Diagnosis not present

## 2023-04-16 DIAGNOSIS — M9901 Segmental and somatic dysfunction of cervical region: Secondary | ICD-10-CM | POA: Diagnosis not present

## 2023-04-16 DIAGNOSIS — M9902 Segmental and somatic dysfunction of thoracic region: Secondary | ICD-10-CM | POA: Diagnosis not present

## 2023-04-16 DIAGNOSIS — M9905 Segmental and somatic dysfunction of pelvic region: Secondary | ICD-10-CM | POA: Diagnosis not present

## 2023-04-16 DIAGNOSIS — M9903 Segmental and somatic dysfunction of lumbar region: Secondary | ICD-10-CM | POA: Diagnosis not present

## 2023-04-18 DIAGNOSIS — M9902 Segmental and somatic dysfunction of thoracic region: Secondary | ICD-10-CM | POA: Diagnosis not present

## 2023-04-18 DIAGNOSIS — M9905 Segmental and somatic dysfunction of pelvic region: Secondary | ICD-10-CM | POA: Diagnosis not present

## 2023-04-18 DIAGNOSIS — M9901 Segmental and somatic dysfunction of cervical region: Secondary | ICD-10-CM | POA: Diagnosis not present

## 2023-04-18 DIAGNOSIS — M9903 Segmental and somatic dysfunction of lumbar region: Secondary | ICD-10-CM | POA: Diagnosis not present

## 2023-04-19 ENCOUNTER — Encounter (HOSPITAL_BASED_OUTPATIENT_CLINIC_OR_DEPARTMENT_OTHER): Payer: Self-pay | Admitting: *Deleted

## 2023-04-19 ENCOUNTER — Other Ambulatory Visit (HOSPITAL_BASED_OUTPATIENT_CLINIC_OR_DEPARTMENT_OTHER): Payer: Self-pay | Admitting: *Deleted

## 2023-04-19 MED ORDER — METOPROLOL TARTRATE 25 MG PO TABS: 25.0000 mg | ORAL_TABLET | Freq: Two times a day (BID) | ORAL | 2 refills | Status: DC

## 2023-04-20 DIAGNOSIS — M9905 Segmental and somatic dysfunction of pelvic region: Secondary | ICD-10-CM | POA: Diagnosis not present

## 2023-04-20 DIAGNOSIS — M9903 Segmental and somatic dysfunction of lumbar region: Secondary | ICD-10-CM | POA: Diagnosis not present

## 2023-04-20 DIAGNOSIS — M9901 Segmental and somatic dysfunction of cervical region: Secondary | ICD-10-CM | POA: Diagnosis not present

## 2023-04-20 DIAGNOSIS — M9902 Segmental and somatic dysfunction of thoracic region: Secondary | ICD-10-CM | POA: Diagnosis not present

## 2023-05-10 DIAGNOSIS — L2989 Other pruritus: Secondary | ICD-10-CM | POA: Diagnosis not present

## 2023-05-10 DIAGNOSIS — L814 Other melanin hyperpigmentation: Secondary | ICD-10-CM | POA: Diagnosis not present

## 2023-05-10 DIAGNOSIS — L538 Other specified erythematous conditions: Secondary | ICD-10-CM | POA: Diagnosis not present

## 2023-05-10 DIAGNOSIS — R208 Other disturbances of skin sensation: Secondary | ICD-10-CM | POA: Diagnosis not present

## 2023-05-10 DIAGNOSIS — L820 Inflamed seborrheic keratosis: Secondary | ICD-10-CM | POA: Diagnosis not present

## 2023-05-10 DIAGNOSIS — L821 Other seborrheic keratosis: Secondary | ICD-10-CM | POA: Diagnosis not present

## 2023-05-10 DIAGNOSIS — D225 Melanocytic nevi of trunk: Secondary | ICD-10-CM | POA: Diagnosis not present

## 2023-05-15 ENCOUNTER — Other Ambulatory Visit (HOSPITAL_BASED_OUTPATIENT_CLINIC_OR_DEPARTMENT_OTHER): Payer: Self-pay | Admitting: *Deleted

## 2023-05-15 MED ORDER — LISINOPRIL 40 MG PO TABS: 40.0000 mg | ORAL_TABLET | Freq: Every day | ORAL | 2 refills | Status: DC

## 2023-07-26 ENCOUNTER — Ambulatory Visit (HOSPITAL_BASED_OUTPATIENT_CLINIC_OR_DEPARTMENT_OTHER): Payer: BC Managed Care – PPO | Admitting: Family Medicine

## 2023-08-15 ENCOUNTER — Other Ambulatory Visit (HOSPITAL_BASED_OUTPATIENT_CLINIC_OR_DEPARTMENT_OTHER): Payer: Self-pay | Admitting: *Deleted

## 2023-08-15 MED ORDER — ROSUVASTATIN CALCIUM 20 MG PO TABS: 20.0000 mg | ORAL_TABLET | Freq: Every day | ORAL | 1 refills | Status: DC

## 2023-08-15 MED ORDER — AMLODIPINE BESYLATE 10 MG PO TABS: 10.0000 mg | ORAL_TABLET | Freq: Every day | ORAL | 1 refills | Status: DC

## 2023-09-19 DIAGNOSIS — L820 Inflamed seborrheic keratosis: Secondary | ICD-10-CM | POA: Diagnosis not present

## 2023-09-19 DIAGNOSIS — L538 Other specified erythematous conditions: Secondary | ICD-10-CM | POA: Diagnosis not present

## 2023-09-19 DIAGNOSIS — Z789 Other specified health status: Secondary | ICD-10-CM | POA: Diagnosis not present

## 2023-11-22 ENCOUNTER — Ambulatory Visit (HOSPITAL_BASED_OUTPATIENT_CLINIC_OR_DEPARTMENT_OTHER): Admitting: Family Medicine

## 2023-12-12 ENCOUNTER — Encounter (HOSPITAL_BASED_OUTPATIENT_CLINIC_OR_DEPARTMENT_OTHER): Payer: Self-pay | Admitting: Family Medicine

## 2023-12-12 ENCOUNTER — Ambulatory Visit (INDEPENDENT_AMBULATORY_CARE_PROVIDER_SITE_OTHER): Admitting: Family Medicine

## 2023-12-12 VITALS — BP 130/73 | HR 80 | Ht 69.0 in | Wt 221.3 lb

## 2023-12-12 DIAGNOSIS — Z0000 Encounter for general adult medical examination without abnormal findings: Secondary | ICD-10-CM

## 2023-12-12 DIAGNOSIS — E049 Nontoxic goiter, unspecified: Secondary | ICD-10-CM | POA: Diagnosis not present

## 2023-12-12 DIAGNOSIS — I10 Essential (primary) hypertension: Secondary | ICD-10-CM

## 2023-12-12 DIAGNOSIS — G4730 Sleep apnea, unspecified: Secondary | ICD-10-CM | POA: Insufficient documentation

## 2023-12-12 DIAGNOSIS — R0600 Dyspnea, unspecified: Secondary | ICD-10-CM | POA: Insufficient documentation

## 2023-12-12 DIAGNOSIS — J449 Chronic obstructive pulmonary disease, unspecified: Secondary | ICD-10-CM

## 2023-12-12 MED ORDER — TRELEGY ELLIPTA 100-62.5-25 MCG/ACT IN AEPB: 1.0000 | INHALATION_SPRAY | Freq: Every day | RESPIRATORY_TRACT | 3 refills | Status: DC

## 2023-12-12 NOTE — Progress Notes (Signed)
    Procedures performed today:    None.  Independent interpretation of notes and tests performed by another provider:   None.  Brief History, Exam, Impression, and Recommendations:    BP 130/73 (BP Location: Right Arm, Patient Position: Sitting, Cuff Size: Large)   Pulse 80   Ht 5' 9 (1.753 m)   Wt 221 lb 4.8 oz (100.4 kg)   SpO2 97%   BMI 32.68 kg/m   Chronic obstructive pulmonary disease, unspecified COPD type (HCC) Assessment & Plan: Patient continues with Trelegy.  Denies any issues medication.  He does report that he will likely be losing insurance at the end of the year and thinks that current inhaler will become cost prohibitive at that time.  He wonders about cheaper alternatives.  He also wonders about evaluation with pulmonology.  He reports that his last visit with the pulmonologist was about 5 to 6 years ago.  He has not met with a pulmonologist locally. We discussed options, patient would like to proceed with referral to establish with pulmonologist locally, referral placed today.  At this time, can continue with Trelegy, he will discuss further with pulmonology regarding inhalers that they may recommend  Orders: -     Pulmonary Visit  Goiter Assessment & Plan: Patient denies any issues with voice changes, breathing, issues with pain.  Possibly has noted some difficulty with swallowing at times. As before, patient advised to have evaluation with endocrinologist, still has not arranged. Referral has been placed previously, patient provided with contact information previously as well.  Unfortunate, it appears that referral has been closed due to timing.  New referral placed today.  Again reviewed importance of having evaluation with endocrinologist Advised that if wait time for appointment with endocrinology is somewhat long, recommend reaching out to us  and we can look into alternative referral options  Orders: -     Ambulatory referral to Endocrinology  Primary  hypertension Assessment & Plan: Blood pressure controlled in office today.  He continues with amlodipine , lisinopril , metoprolol .  No reported issues with chest pain, headaches, lightheadedness or dizziness.  Does not need refill on medications today At this time, can continue with current medication regimen, no changes to be made today Recommend intermittent monitoring of blood pressure at home, DASH diet   Wellness examination -     CBC with Differential/Platelet; Future -     Comprehensive metabolic panel with GFR; Future -     Hemoglobin A1c; Future -     Lipid panel; Future -     TSH Rfx on Abnormal to Free T4; Future  Other orders -     Trelegy Ellipta ; Inhale 1 Inhalation into the lungs daily.  Dispense: 90 each; Refill: 3  Return if symptoms worsen or fail to improve.   ___________________________________________ Kyle Cannedy de [place], MD, ABFM, CAQSM Primary Care and Sports Medicine [facility]

## 2023-12-12 NOTE — Patient Instructions (Signed)
  Medication Instructions:  Your physician recommends that you continue on your current medications as directed. Please refer to the Current Medication list given to you today. --If you need a refill on any your medications before your next appointment, please call your pharmacy first. If no refills are authorized on file call the office.-- Lab Work: Your physician has recommended that you have lab work today: 1 week before next visit  If you have labs (blood work) drawn today and your tests are completely normal, you will receive your results via MyChart message OR a phone call from our staff.  Please ensure you check your voicemail in the event that you authorized detailed messages to be left on a delegated number. If you have any lab test that is abnormal or we need to change your treatment, we will call you to review the results.  Referrals/Procedures/Imaging: Referrals placed  Follow-Up: Your next appointment:   Your physician recommends that you schedule a follow-up appointment in: keep physical nov with Dr. de Peru  You will receive a text message or e-mail with a link to a survey about your care and experience with us  today! We would greatly appreciate your feedback!   Thanks for letting us  be apart of your health journey!!  Primary Care and Sports Medicine   Dr. Quintin sheerer Peru   We encourage you to activate your patient portal called MyChart.  Sign up information is provided on this After Visit Summary.  MyChart is used to connect with patients for Virtual Visits (Telemedicine).  Patients are able to view lab/test results, encounter notes, upcoming appointments, etc.  Non-urgent messages can be sent to your provider as well. To learn more about what you can do with MyChart, please visit --  [email or web address].

## 2023-12-12 NOTE — Assessment & Plan Note (Addendum)
 Patient denies any issues with voice changes, breathing, issues with pain.  Possibly has noted some difficulty with swallowing at times. As before, patient advised to have evaluation with endocrinologist, still has not arranged. Referral has been placed previously, patient provided with contact information previously as well.  Unfortunate, it appears that referral has been closed due to timing.  New referral placed today.  Again reviewed importance of having evaluation with endocrinologist Advised that if wait time for appointment with endocrinology is somewhat long, recommend reaching out to us  and we can look into alternative referral options

## 2023-12-12 NOTE — Assessment & Plan Note (Signed)
 Blood pressure controlled in office today.  He continues with amlodipine , lisinopril , metoprolol .  No reported issues with chest pain, headaches, lightheadedness or dizziness.  Does not need refill on medications today At this time, can continue with current medication regimen, no changes to be made today Recommend intermittent monitoring of blood pressure at home, DASH diet

## 2023-12-12 NOTE — Assessment & Plan Note (Signed)
 Patient continues with Trelegy.  Denies any issues medication.  He does report that he will likely be losing insurance at the end of the year and thinks that current inhaler will become cost prohibitive at that time.  He wonders about cheaper alternatives.  He also wonders about evaluation with pulmonology.  He reports that his last visit with the pulmonologist was about 5 to 6 years ago.  He has not met with a pulmonologist locally. We discussed options, patient would like to proceed with referral to establish with pulmonologist locally, referral placed today.  At this time, can continue with Trelegy, he will discuss further with pulmonology regarding inhalers that they may recommend

## 2023-12-25 DIAGNOSIS — R52 Pain, unspecified: Secondary | ICD-10-CM | POA: Diagnosis not present

## 2023-12-25 DIAGNOSIS — Z20822 Contact with and (suspected) exposure to covid-19: Secondary | ICD-10-CM | POA: Diagnosis not present

## 2023-12-25 DIAGNOSIS — J029 Acute pharyngitis, unspecified: Secondary | ICD-10-CM | POA: Diagnosis not present

## 2023-12-25 NOTE — Progress Notes (Signed)
 Patient ID:  Kyle Mcdonald is a 58 y.o.  03/15/1966   male     Sore Throat and Headache (Symptoms started yesterday)   New problem to me History given by patient:  Patient has had new onset sore throat, HA, and body aches for about 2 days. Patient recently went to a convention and tends to get sick. Patient denies chest pain or SOB Accompanied by:         self and wife Duration: 2 days COVID Vaccinated  yes   Known COVID-19 exposure: no    Symptoms:  (below are positive unless otherwised marked) No increase in Allergies  over the past 2 weeks or worsening of year round allergies  Headache  Nasal congestion  w  wo  facial pain and pressure  No Ear pain/pressure ST described as mild to moderate    pain raw burns hurts to swallow        No Cough  dry  +/- productive No fever (ish)  chills sweats  Fatigue  Body aches No shob with exertion No rash No loss of taste No loss of smell No nausea  No vomiting  No diarrhea    No history of asthma No history of COPD No history of tobacco use Timing:  progressively worsening  Alleviating:  Has tried OTC meds with minimal symptoms relief Aggravating:  being up and mobile     Review of Systems: All others reviewed and negative except as listed above.   Past Medical History, Past surgery History, Allergies, Social history, and Family History were reviewed and updated.  During this patient encounter, the patient was wearing a mask.  Throughout this encounter, I was wearing at least a surgical mask.  I was not within 6 feet of this patient for more than 15 minutes without eye protection when they were not wearing mask.   EXAM:  BP 143/83 (BP Location: Left arm, Patient Position: Sitting)   Pulse 76   Temp 97.5 F (36.4 C) (Tympanic)   Resp 20   SpO2 100%    Constitutional:  WN, WD, NAD  Neuro:  A&O x 3   Head:  Atraumatic, normocephalic  Eyes:  PERRL, conjunctiva noninjected  Nares:  Mucosa erythematous with clear  rhinorrhea   no  ttp over max  / frontal sinuses    TM:    dull   /  bulging   OP:  Cobblestone, erythematous, beefy red  Neck: No lymphadenopathy  CV:  RRR without MRG  Resp:  Nonlabored, good airflow, no use of accessory muscles, no tripoding, NO  wheezing, without rales or rhonchi.  Deep inspiration causes cough  Abd: soft NTND + BS -HSM, No guarding, no rebound tenderness, No CVA Skin:  No lesions, focal rashes, or ulcerations.  No results found for this or any previous visit (from the past 24 hours).      Lab work  were reviewed and incorporated into the decision making process.  Further disposition based on pending labs if needed.  Impression/Plan 1. Sore throat (Primary) - POC SARS-COV-2 SYMPTOMATIC (IDNOW) - POC Rapid Strep A - Throat Culture  2. Body aches - POC SARS-COV-2 SYMPTOMATIC (IDNOW) - POC Rapid Strep A        I am treating patient with mouth wash and cough suppressant for sore throat and HA. Pending throat culture if positive will treat appropriately.   Patient has been instructed on RX/ OTC medications, dosages, side effects, and possible interactions as associated with each  diagnosis in my impression and plan above.  2.   Patient education  (verbal/handout) given on diagnosis, pathophysiology, treatment of diagnosis, side effects of medication use for treatment, restrictions while taking medication, supportive       measures such  as  supportive care, pushing fluids, BRAT diet, fluid rehydration protocol if needed.       Red Flags associated with diagnosis/es were reviewed and patient instructed on action plan if red flags develop.  3.  Patient was instructed on follow up care:        Discharged to Home Care with Follow up as previously directed by PCP              They have been instructed that if symptoms worse that should go to Urgent Care, go to the nearest ED, or activate EMS.  4.  Patient agreed with plan and voiced understanding.  NO  barriers to adherence perceived by myself.  Urgent Care Disposition:  Follow up with PCP

## 2023-12-27 NOTE — Progress Notes (Unsigned)
 New Patient Pulmonology Office Visit   Subjective:  Patient ID: Kyle Mcdonald., male    DOB: [birth year]  MRN: [id number]  Referred by: de [place], Raymond J, MD  CC: No chief complaint on file.   HPI Kyle Carlynn Gyasi Hazzard. is a 58 y.o. male with HTN, HLD, and GERD who presents for initial evaluation in the setting of ***.    CTD Symptoms:   PMH:  - Hx of TB ***  - Hx of endemic fungal infections ***  - Hx of Sarcoidosis ***  - Hx of CTD ***  - Hx of lymphoma or other hematological malignancies ***  - Hx of irradiation to the chest or immunotherapy ***   PSH:   Medications:   Allergies:   Social Hx: - Smoking ***  - Second hand smoking ***  - Vaping ***  - Alcohol ***  - Illicit substances ***  - Indoor emission from household combustion ***  - Hobbies (e.g. wood work, Surveyor, quantity, bird breeding etc...)  - Landscape architect (e.g. mold) ***  - Pets ***  - Birds ***   Occupational exposures:  [ ]  Asbestos - Holiday representative workers, Medical sales representative, Therapist, sports, railroad, Contractor radiation - uranium mining [ ]  Vinyl chloride - pulp & paper workers  [ ]  Arsenic - smelting of ores, Chief Executive Officer, Technical sales engineer  [ ]  Beryllium - Archivist workers, Geophysical data processor, Pensions consultant workers, jewelers  [ ]  chromium - Financial trader, welding, tanning industries  [ ]  Nickel - Chief Strategy Officer, Naval architect, Physiological scientist, glass workers, Health and safety inspector, metal workers   Family Hx:  - Lung disease ***  - CTD ***  - Sarcoidosis ***  - Cancer ***    {PULM QUESTIONNAIRES (Optional):33196}  ROS  Allergies: Patient has no known allergies.  Current Outpatient Medications:    albuterol  (VENTOLIN  HFA) 108 (90 Base) MCG/ACT inhaler, Inhale 1-2 puffs into the lungs every 6 (six) hours as needed for wheezing or shortness of breath., Disp: 1 each, Rfl: 0   amLODipine  (NORVASC ) 10 MG tablet, Take 1 tablet (10 mg total) by mouth daily.,  Disp: 90 tablet, Rfl: 1   Fluticasone-Umeclidin-Vilant (TRELEGY ELLIPTA ) 100-62.5-25 MCG/ACT AEPB, Inhale 1 Inhalation into the lungs daily., Disp: 90 each, Rfl: 3   lisinopril  (ZESTRIL ) 40 MG tablet, Take 1 tablet (40 mg total) by mouth daily., Disp: 90 tablet, Rfl: 2   metoprolol  tartrate (LOPRESSOR ) 25 MG tablet, Take 1 tablet (25 mg total) by mouth 2 (two) times daily., Disp: 90 tablet, Rfl: 2   pantoprazole  (PROTONIX ) 40 MG tablet, Take 1 tablet (40 mg total) by mouth daily., Disp: 90 tablet, Rfl: 2   rosuvastatin  (CRESTOR ) 20 MG tablet, Take 1 tablet (20 mg total) by mouth daily., Disp: 90 tablet, Rfl: 1 Past Medical History:  Diagnosis Date   Cancer (HCC)    COPD (chronic obstructive pulmonary disease) (HCC)    GERD (gastroesophageal reflux disease)    Heart murmur    Hypertension    Past Surgical History:  Procedure Laterality Date   FRACTURE SURGERY     Family History  Problem Relation Age of Onset   Hypertension Father    Social History   Socioeconomic History   Marital status: Married    Spouse name: Rollene   Number of children: 2   Years of education: Not on file   Highest education level: Not on file  Occupational History   Not on file  Tobacco Use   Smoking status:  Former    Current packs/day: 0.00    Types: Cigarettes    Quit date: 2012    Years since quitting: 13.6    Passive exposure: Past   Smokeless tobacco: Never  Vaping Use   Vaping status: Never Used  Substance and Sexual Activity   Alcohol use: Yes    Comment: couple beers a day   Drug use: Never   Sexual activity: Yes    Birth control/protection: None  Other Topics Concern   Not on file  Social History Narrative   Not on file   Social Drivers of Health   Financial Resource Strain: Low Risk  (10/02/2019)   Received from [facility]   Overall Financial Resource Strain (CARDIA)    Difficulty of Paying Living Expenses: Not hard at all  Food Insecurity: No Food Insecurity (10/02/2019)    [facility] from [facility]   Hunger Vital Sign    Within the past 12 months, you worried that your food would run out before you got the money to buy more.: Never true    Within the past 12 months, the food you bought just didn't last and you didn't have money to get more.: Never true  Transportation Needs: No Transportation Needs (10/02/2019)   Received from [facility] - Transportation    Lack of Transportation (Medical): No    Lack of Transportation (Non-Medical): No  Physical Activity: Inactive (10/02/2019)   Received from [facility]   Exercise Vital Sign    On average, how many days per week do you engage in moderate to strenuous exercise (like a brisk walk)?: 0 days    On average, how many minutes do you engage in exercise at this level?: 0 min  Stress: No Stress Concern Present (10/02/2019)   Received from [facility] - Occupational Stress Questionnaire    Feeling of Stress : Not at all  Social Connections: Unknown (01/01/2023)   Received from [facility]   Social Network    Social Network: Not on file  Intimate Partner Violence: Unknown (01/01/2023)   Received from [facility]   HITS    Physically Hurt: Not on file    Insult or Talk Down To: Not on file    Threaten Physical Harm: Not on file    Scream or Curse: Not on file       Objective:  There were no vitals taken for this visit. {Pulm Vitals (Optional):32837}  Physical Exam  Diagnostic Review:  {Labs (Optional):32838}  CTA Chest 06/2022 with RUL atelectasis, mild bilateral upper lobe emphysematous changes, small calcified RLL solitary pulmonary nodule.    Assessment & Plan:   Assessment & Plan   No orders of the defined types were placed in this encounter.     No follow-ups on file.   Markiesha Delia, MD

## 2023-12-28 ENCOUNTER — Encounter (HOSPITAL_BASED_OUTPATIENT_CLINIC_OR_DEPARTMENT_OTHER): Payer: Self-pay | Admitting: Pulmonary Disease

## 2023-12-28 ENCOUNTER — Ambulatory Visit (HOSPITAL_BASED_OUTPATIENT_CLINIC_OR_DEPARTMENT_OTHER): Admitting: Pulmonary Disease

## 2023-12-28 VITALS — BP 142/78 | HR 76 | Ht 69.0 in | Wt 219.0 lb

## 2023-12-28 DIAGNOSIS — E049 Nontoxic goiter, unspecified: Secondary | ICD-10-CM

## 2023-12-28 DIAGNOSIS — F17211 Nicotine dependence, cigarettes, in remission: Secondary | ICD-10-CM

## 2023-12-28 DIAGNOSIS — J449 Chronic obstructive pulmonary disease, unspecified: Secondary | ICD-10-CM

## 2023-12-28 DIAGNOSIS — Z122 Encounter for screening for malignant neoplasm of respiratory organs: Secondary | ICD-10-CM | POA: Diagnosis not present

## 2023-12-28 MED ORDER — STIOLTO RESPIMAT 2.5-2.5 MCG/ACT IN AERS: 2.0000 | INHALATION_SPRAY | Freq: Every day | RESPIRATORY_TRACT | 4 refills | Status: AC

## 2023-12-28 MED ORDER — ALBUTEROL SULFATE HFA 108 (90 BASE) MCG/ACT IN AERS: 2.0000 | INHALATION_SPRAY | Freq: Four times a day (QID) | RESPIRATORY_TRACT | 6 refills | Status: AC | PRN

## 2023-12-28 NOTE — Assessment & Plan Note (Signed)
 Patient has large right goiter that is compressing on his trachea and esophagus causing mass effect including dyspnea and dysphagia. I advised patient to see a surgical endocrinologist to discuss resection given that it is likely causing significant dyspnea.

## 2023-12-28 NOTE — Assessment & Plan Note (Signed)
 Patient with extensive smoking hx of 45 PY and known diagnosis of COPD. PFTs completed in [place] . We tried to look in his mychart but I could not find PFTs. The patient is currently on ICS/LAMA/LABA with Trelegy. His MMRC > 2 but he has no exacerbations. He is likely class B. He does not need the ICS component. I advised him to switch from Trelegy to [employer] (LAMA/LABA). We will update PFTs with next visit.

## 2023-12-28 NOTE — Progress Notes (Signed)
 New Patient Pulmonology Office Visit   Subjective:  Patient ID: Kyle Carlynn Daneen Mickey., male    DOB: 1966-02-25  MRN: [id number]  Referred by: de [place], Raymond J, MD  CC:  Chief Complaint  Patient presents with   Establish Care    HPI Kyle Mcdonald. is a 58 y.o. male with HTN, HLD, and GERD who presents for initial evaluation in the setting of dyspnea.  Warden is primary care provider. Diagnosed with COPD in 2019 in [place] . Moved her for 5 years. Short of breath on exertion. Had non-hodgkin's lymphoma and got radiation as a kid which caused him to have some fibrotic changes in his lung (16 years ol). Coughing and wheezing. He's been on Trelegy for many yearas and it seems to be helping. He has a rescue inhaler but not using it very much. Gets jittery with it.   Medications: - Trelegy once daily - Albuterol  (seldom uses it) - Nebulizer machine (not using it)  Exacerbations: none, no hospitalizations  PMH:  - HTN - Goiter, growing slowly and affecting swallowing/breathing  PSH:  - Wrist surgery  Medications: reviewed in chart  Allergies:  none  Social Hx: - Smoking quit in 2012, 1.5 pack a day x 30 years = 45 PY  - Second hand smoking: none  - Vaping: none  - Alcohol: none  - Illicit substances: none  - Indoor emission from household combustion: none  - Hobbies (e.g. wood work, Surveyor, quantity, bird breeding etc...): none - Environmental (e.g. mold): none  - Pets: dogs x 2  - Birds: none   Occupational exposures: own tools and franchise, break Astronomer [ ]  Asbestos - Holiday representative workers, Medical sales representative, Therapist, sports, railroad, Doctor, hospital radiation - uranium mining  [ ]  Vinyl chloride - pulp & paper workers  [ ]  Arsenic - smelting of ores, Chief Executive Officer, Technical sales engineer  [ ]  Beryllium - Archivist workers, Geophysical data processor, Pensions consultant workers, Health and safety inspector  [ ]  chromium - Financial trader, welding, tanning  industries  [ ]  Nickel - Chief Strategy Officer, Naval architect, Physiological scientist, glass workers, Health and safety inspector, metal workers   Family Hx:  - Lung disease: none  - Cancer: paternal uncle with lung cancer, skin cancer, paternal aunt with brain cancer   ROS  Allergies: Patient has no allergy information on record.  Current Outpatient Medications:    albuterol  (VENTOLIN  HFA) 108 (90 Base) MCG/ACT inhaler, Inhale 1-2 puffs into the lungs every 6 (six) hours as needed for wheezing or shortness of breath., Disp: 1 each, Rfl: 0   albuterol  (VENTOLIN  HFA) 108 (90 Base) MCG/ACT inhaler, Inhale 2 puffs into the lungs every 6 (six) hours as needed for wheezing or shortness of breath., Disp: 34 g, Rfl: 6   amLODipine  (NORVASC ) 10 MG tablet, Take 1 tablet (10 mg total) by mouth daily., Disp: 90 tablet, Rfl: 1   lisinopril  (ZESTRIL ) 40 MG tablet, Take 1 tablet (40 mg total) by mouth daily., Disp: 90 tablet, Rfl: 2   metoprolol  tartrate (LOPRESSOR ) 25 MG tablet, Take 1 tablet (25 mg total) by mouth 2 (two) times daily., Disp: 90 tablet, Rfl: 2   pantoprazole  (PROTONIX ) 40 MG tablet, Take 1 tablet (40 mg total) by mouth daily., Disp: 90 tablet, Rfl: 2   rosuvastatin  (CRESTOR ) 20 MG tablet, Take 1 tablet (20 mg total) by mouth daily., Disp: 90 tablet, Rfl: 1   Tiotropium Bromide-Olodaterol (STIOLTO RESPIMAT ) 2.5-2.5 MCG/ACT AERS, Inhale 2 puffs into the lungs daily., Disp: 12 each,  Rfl: 4 Past Medical History:  Diagnosis Date   Cancer (HCC)    COPD (chronic obstructive pulmonary disease) (HCC)    GERD (gastroesophageal reflux disease)    Heart murmur    Hypertension    Past Surgical History:  Procedure Laterality Date   FRACTURE SURGERY     Family History  Problem Relation Age of Onset   Hypertension Father    Social History   Socioeconomic History   Marital status: Married    Spouse name: Rollene   Number of children: 2   Years of education: Not on file   Highest education level: Not on file   Occupational History   Not on file  Tobacco Use   Smoking status: Former    Current packs/day: 0.00    Types: Cigarettes    Quit date: 2012    Years since quitting: 13.6    Passive exposure: Past   Smokeless tobacco: Never  Vaping Use   Vaping status: Never Used  Substance and Sexual Activity   Alcohol use: Yes    Comment: couple beers a day   Drug use: Never   Sexual activity: Yes    Birth control/protection: None  Other Topics Concern   Not on file  Social History Narrative   Not on file   Social Drivers of Health   Financial Resource Strain: Low Risk  (10/02/2019)   Received from [facility]   Overall Financial Resource Strain (CARDIA)    Difficulty of Paying Living Expenses: Not hard at all  Food Insecurity: No Food Insecurity (10/02/2019)   Received [facility]   Hunger Vital Sign    Within the past 12 months, you worried that your food would run out before you got the money to buy more.: Never true    Within the past 12 months, the food you bought just didn't last and you didn't have money to get more.: Never true  Transportation Needs: No Transportation Needs (10/02/2019)   Received from [facility] - Transportation    Lack of Transportation (Medical): No    Lack of Transportation (Non-Medical): No  Physical Activity: Inactive (10/02/2019)   Received from [facility]   Exercise Vital Sign    On average, how many days per week do you engage in moderate to strenuous exercise (like a brisk walk)?: 0 days    On average, how many minutes do you engage in exercise at this level?: 0 min  Stress: No Stress Concern Present (10/02/2019)   Received from [facility] - Occupational Stress Questionnaire    Feeling of Stress : Not at all  Social Connections: Unknown (01/01/2023)   Received from [facility]   Social Network    Social Network: Not on file  Intimate Partner Violence: Unknown (01/01/2023)    Received from [facility]   HITS    Physically Hurt: Not on file    Insult or Talk Down To: Not on file    Threaten Physical Harm: Not on file    Scream or Curse: Not on file       Objective:  BP (!) 142/78 (BP Location: Left Arm, Patient Position: Sitting, Cuff Size: Large)   Pulse 76   Ht 5' 9 (1.753 m)   Wt 219 lb (99.3 kg)   SpO2 97%   BMI 32.34 kg/m  Wt Readings from Last 3 Encounters:  12/28/23 219 lb (99.3 kg)  12/12/23 221 lb 4.8 oz (  100.4 kg)  03/28/23 225 lb (102.1 kg)   BMI Readings from Last 3 Encounters:  12/28/23 32.34 kg/m  12/12/23 32.68 kg/m  03/28/23 33.23 kg/m   SpO2 Readings from Last 3 Encounters:  12/28/23 97%  12/12/23 97%  03/28/23 98%    Physical Exam General: NAD, alert, WD, WN Eyes: PERRL, no scleral icterus ENMT: oropharynx clear, good dentition, no oral lesions, mallampati score II Skin: warm, intact, no rashes Neck: large right sided goiter that is easily visible and palpable CV: RRR, loud systolic murmur heard best in RUSB, nl S1 and S2, 1+ pitting peripheral edema Resp: clear to auscultation bilaterally, no wheezes, rales, or rhonchi, normal effort, no clubbing/cyanosis Abdom: Normoactive bowel sounds, soft, nontender, nondistended, no hepatosplenomegaly Neuro: Awake alert oriented to person place time and situation  Diagnostic Review:  Last CBC Lab Results  Component Value Date   WBC 7.3 06/19/2022   HGB 16.1 06/19/2022   HCT 45.5 06/19/2022   MCV 88.3 06/19/2022   MCH 31.3 06/19/2022   RDW 12.7 06/19/2022   PLT 148 (L) 06/19/2022   Last metabolic panel Lab Results  Component Value Date   GLUCOSE 128 (H) 06/19/2022   NA 137 06/19/2022   K 4.9 06/19/2022   CL 104 06/19/2022   CO2 24 06/19/2022   BUN 11 06/19/2022   CREATININE 0.96 06/19/2022   GFRNONAA >60 06/19/2022   CALCIUM  9.2 06/19/2022   PROT 6.6 02/13/2022   ALBUMIN 4.3 02/13/2022   LABGLOB 2.3 02/13/2022   AGRATIO 1.9 02/13/2022   BILITOT 0.7  02/13/2022   ALKPHOS 93 02/13/2022   AST 23 02/13/2022   ALT 40 02/13/2022   ANIONGAP 9 06/19/2022   CTA Chest 06/2022 with RUL atelectasis, mild bilateral upper lobe emphysematous changes, small calcified RLL solitary pulmonary nodule.    Assessment & Plan:   Assessment & Plan Chronic obstructive pulmonary disease, unspecified COPD type (HCC) Patient with extensive smoking hx of 45 PY and known diagnosis of COPD. PFTs completed in [place] . We tried to look in his mychart but I could not find PFTs. The patient is currently on ICS/LAMA/LABA with Trelegy. His MMRC > 2 but he has no exacerbations. He is likely class B. He does not need the ICS component. I advised him to switch from Trelegy to [employer] (LAMA/LABA). We will update PFTs with next visit. Goiter Patient has large right goiter that is compressing on his trachea and esophagus causing mass effect including dyspnea and dysphagia. I advised patient to see a surgical endocrinologist to discuss resection given that it is likely causing significant dyspnea. Cigarette nicotine dependence in remission Quit 12 years ago, 45 PY Encounter for screening for lung cancer The patient is a former cigarette smoker and has a pack year history of 45 years and stop smoking 12 years ago. They are currently asymptomatic and between the ages of 21 and 34 years old and has quit smoking less than 15 years ago. They do meet criteria for a low dose CT scan can for lung cancer screening. They were counseled regarding the necessity for lung cancer screening and is agreeable to have this imaging performed. Referral and LDCT ordered.  Orders Placed This Encounter  Procedures   CT CHEST LUNG CA SCREEN LOW DOSE W/O CM   Ambulatory Referral for Lung Cancer Scre   Pulmonary function test   I spent 60 minutes reviewing patient's chart including prior consultant notes, imaging, and PFTs as well as face-to-face with the patient, over half in discussion  of the diagnosis  and the importance of compliance with the treatment plan.  Return in about 4 months (around 04/28/2024).   Nirvan Laban, MD

## 2023-12-28 NOTE — Patient Instructions (Signed)
-   Please stop using trelegy when you are done with the last one you have - Start using Stiolto 2 puffs in the morning - Can use albuterol  as needed whenever short of breath, wheezing or coughing - Please get your CT scan and breathing test done before next visit - Come back ion 3-4 months - I agree that your thyroid  may need to be removed to improve your breathing

## 2024-01-04 ENCOUNTER — Telehealth: Payer: Self-pay

## 2024-01-04 DIAGNOSIS — Z87891 Personal history of nicotine dependence: Secondary | ICD-10-CM

## 2024-01-04 DIAGNOSIS — Z122 Encounter for screening for malignant neoplasm of respiratory organs: Secondary | ICD-10-CM

## 2024-01-04 NOTE — Telephone Encounter (Signed)
 Lung Cancer Screening Narrative/Criteria Questionnaire (Cigarette Smokers Only- No Cigars/Pipes/vapes)   Kyle Mcdonald Daneen Raddle.   SDMV:01/14/2024 at 8:00 am with Natalie       March 01, 1966                           LDCT: 01/18/2024 at 9:00 am     58 y.o.                                     Phone: [phone]  Lung Screening Narrative (confirm age 52-77 yrs Medicare / 50-80 yrs Private pay insurance)   Insurance information: BCBS   Referring Provider: Catherine, MD   This screening involves an initial phone call with a team member from our program. It is called a shared decision making visit. The initial meeting is required by  insurance and Medicare to make sure you understand the program. This appointment takes about 15-20 minutes to complete. You will complete the screening scan at your scheduled date/time.  This scan takes about 5-10 minutes to complete. You can eat and drink normally before and after the scan.  Criteria questions for Lung Cancer Screening:   Are you a current or former smoker? Former Age began smoking: 15   If you are a former smoker, what year did you quit smoking? Quit 2012 (within 15 yrs)   To calculate your smoking history, I need an accurate estimate of how many packs of cigarettes you smoked per day and for how many years. (Not just the number of PPD you are now smoking)   Years smoking 30 x Packs per day 1.5 = Pack years 44   (at least 20 pack yrs)   (Make sure they understand that we need to know how much they have smoked in the past, not just the number of PPD they are smoking now)  Do you have a personal history of cancer?  Yes    Do you have a family history of cancer? Yes  (cancer type and and relative) Grandfather had colon. Several uncles with different kinds.   Are you coughing up blood?  No  Have you had unexplained weight loss of 15 lbs or more in the last 6 months? No  It looks like you meet all criteria.  When would be a good time for us  to  schedule you for this screening?   Additional information: N/A

## 2024-01-14 ENCOUNTER — Ambulatory Visit: Admitting: *Deleted

## 2024-01-14 ENCOUNTER — Encounter: Payer: Self-pay | Admitting: *Deleted

## 2024-01-14 DIAGNOSIS — Z87891 Personal history of nicotine dependence: Secondary | ICD-10-CM

## 2024-01-14 NOTE — Progress Notes (Signed)
  Virtual Visit via Telephone Note  I connected with Kyle Mcdonald. on 01/14/24 at  8:00 AM EDT by telephone and verified that I am speaking with the correct person using two identifiers.  Location: Patient: Kyle Mcdonald. [place] Provider: Laneta Speaks, RN   I discussed the limitations, risks, security and privacy concerns of performing an evaluation and management service by telephone and the availability of in person appointments. I also discussed with the patient that there may be a patient responsible charge related to this service. The patient expressed understanding and agreed to proceed.   Shared Decision Making Visit Lung Cancer Screening Program [phone])   Eligibility: Age 58 y.o. Pack Years Smoking History Calculation 45 (# packs/per year x # years smoked) Recent History of coughing up blood  no Unexplained weight loss? no ( >Than 15 pounds within the last 6 months ) Prior History Lung / other cancer no (Diagnosis within the last 5 years already requiring surveillance chest CT Scans). Smoking Status Former Smoker Former Smokers: Years since quit: 13 years  Quit Date: 2012  Visit Components: Discussion included one or more decision making aids. yes Discussion included risk/benefits of screening. yes Discussion included potential follow up diagnostic testing for abnormal scans. yes Discussion included meaning and risk of over diagnosis. yes Discussion included meaning and risk of False Positives. yes Discussion included meaning of total radiation exposure. yes  Counseling Included: Importance of adherence to annual lung cancer LDCT screening. yes Impact of comorbidities on ability to participate in the program. yes Ability and willingness to under diagnostic treatment. yes  Smoking Cessation Counseling: Current Smokers:  Discussed importance of smoking cessation. yes Information about tobacco cessation classes and interventions provided to patient.  yes Patient provided with ticket for LDCT Scan. no Symptomatic Patient. no  Diagnosis Code: Tobacco Use Z72.0 Asymptomatic Patient yes   Counseling Counseled patient 4 minutes regarding tobacco use.   Former Smokers:  Discussed the importance of maintaining cigarette abstinence. yes Diagnosis Code: Personal History of Nicotine Dependence. S12.108 Information about tobacco cessation classes and interventions provided to patient. Yes Patient provided with ticket for LDCT Scan. no Written Order for Lung Cancer Screening with LDCT placed in Epic. Yes (CT Chest Lung Cancer Screening Low Dose W/O CM) [id number] Z12.2-Screening of respiratory organs Z87.891-Personal history of nicotine dependence   Laneta Speaks, RN

## 2024-01-14 NOTE — Patient Instructions (Signed)

## 2024-01-18 ENCOUNTER — Ambulatory Visit

## 2024-01-18 DIAGNOSIS — Z87891 Personal history of nicotine dependence: Secondary | ICD-10-CM | POA: Diagnosis not present

## 2024-01-18 DIAGNOSIS — Z122 Encounter for screening for malignant neoplasm of respiratory organs: Secondary | ICD-10-CM

## 2024-01-19 DIAGNOSIS — Z87891 Personal history of nicotine dependence: Secondary | ICD-10-CM | POA: Diagnosis not present

## 2024-01-19 DIAGNOSIS — S61210A Laceration without foreign body of right index finger without damage to nail, initial encounter: Secondary | ICD-10-CM | POA: Diagnosis not present

## 2024-01-19 DIAGNOSIS — W260XXA Contact with knife, initial encounter: Secondary | ICD-10-CM | POA: Diagnosis not present

## 2024-01-23 NOTE — Progress Notes (Signed)
 Virtual Visit via Telephone Note   I connected with Sheena Carlynn Daneen Mickey. on 01/14/24 at  8:00 AM EDT by telephone and verified that I am speaking with the correct person using two identifiers.   Location: Patient: at home Provider: [address], [place], [place], [address]    I discussed the limitations, risks, security and privacy concerns of performing an evaluation and management service by telephone and the availability of in person appointments. I also discussed with the patient that there may be a patient responsible charge related to this service. The patient expressed understanding and agreed to proceed.

## 2024-01-31 ENCOUNTER — Ambulatory Visit: Payer: Self-pay | Admitting: Endocrinology

## 2024-01-31 ENCOUNTER — Encounter: Payer: Self-pay | Admitting: Endocrinology

## 2024-01-31 VITALS — BP 150/100 | HR 82 | Resp 16 | Ht 69.0 in | Wt 224.6 lb

## 2024-01-31 DIAGNOSIS — E041 Nontoxic single thyroid nodule: Secondary | ICD-10-CM | POA: Diagnosis not present

## 2024-01-31 DIAGNOSIS — E042 Nontoxic multinodular goiter: Secondary | ICD-10-CM | POA: Diagnosis not present

## 2024-01-31 LAB — T4, FREE: Free T4: 1 ng/dL (ref 0.8–1.8)

## 2024-01-31 LAB — TSH: TSH: 4.04 m[IU]/L (ref 0.40–4.50)

## 2024-01-31 NOTE — Progress Notes (Signed)
 Outpatient Endocrinology Note Kyle Jacayla Nordell, MD   Patient's Name: Kyle Mcdonald.    DOB: 11-11-1965    MRN: [id number]  REASON OF VISIT: New consult for thyroid  nodules   PCP: de [place], Quintin PARAS, MD  REFERRING PROVIDER: [place], Quintin PARAS, MD  HISTORY OF PRESENT ILLNESS:   Kyle Mcdonald. is a 58 y.o. old male with past medical history as listed below is presented for new consult for thyroid  nodules.  Pertinent Thyroid  History :  Patient is referred to endocrinology for evaluation and management of known thyroid  nodules.   Patient is known to have thyroid  nodule for 20+ years.  He reports he was diagnosed when he was in [place] , had needle biopsy 20+ years ago when thyroid  nodule was around 3 cm in [place]  and reportedly benign at that time.  Patient reports right thyroid  nodule has been gradually enlarging over 7 to 8 years, and lately in the last few months he is having neck compressive symptoms with difficulty breathing on the left lateral side, intermittent dysphagia.  He denies change in voice.  No choking.   Patient had ultrasound thyroid  in May 06, 2022 reviewed images showed large solid almost completely in the mid right lobe measuring 8.2 x 5.9 x 4.5 cm and left inferior thyroid  nodule measuring 1.9 x 1.4 x 1.2 cm solid, hypoechoic, left inferior thyroid  nodule 1.3 x 1.2 x 1.0 cm.  There was a plan by primary care provider for referral to endocrinology and thyroid  needle biopsy.  Patient reports he is not aware about the plan he has not had needle biopsy of thyroid  lately.  Patient had CT chest on January 18, 2024 reviewed images noted right thyroid  mass measuring 6.4 x 7.0 cm with leftward shift of trachea with compressive features.  12 mm low right paratracheal lymph node, similar. No pathologically enlarged mediastinal or axillary lymph nodes.  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  Patient denies  family history of thyroid  disorder or thyroid  cancer.  Patient had mildly elevated TSH in June 2023.  He has not been on thyroid  medication.  - Thyroid  US  on March 06, 2022: reviewed images showed   CLINICAL DATA:  Goiter.   elevated TSH, normal free T4, TPO negative   EXAM: THYROID  ULTRASOUND   TECHNIQUE: Ultrasound examination of the thyroid  gland and adjacent soft tissues was performed.   COMPARISON:  None Available.   FINDINGS: Parenchymal Echotexture: Markedly heterogenous   Isthmus: 0.3 cm   Right lobe: 9.6 x 5.4 x 5.0 cm   Left lobe: 4.9 x 2.0 x 1.0 cm   _________________________________________________________   Estimated total number of nodules >/= 1 cm: 3   Number of spongiform nodules >/=  2 cm not described below (TR1): 0   Number of mixed cystic and solid nodules >/= 1.5 cm not described below (TR2): 0   _________________________________________________________   Nodule # 1:   Location: RIGHT; Mid   Maximum size: 8.2 cm; Other 2 dimensions: 5.9 x 4.5 cm   Composition: solid/almost completely solid (2)   Echogenicity: isoechoic (1)   Shape: not taller-than-wide (0)   Margins: ill-defined (0)   Echogenic foci: none (0)   ACR TI-RADS total points: 3.   ACR TI-RADS risk category: TR3 (3 points).   ACR TI-RADS recommendations:   **Given size (>/= 2.5 cm) and appearance, fine needle aspiration of this mildly suspicious nodule should be considered based on TI-RADS  criteria.   _________________________________________________________   Nodule # 2:   Location: LEFT; Inferior   Maximum size: 1.9 cm; Other 2 dimensions: 1.4 x 1.2 cm   Composition: solid/almost completely solid (2)   Echogenicity: hypoechoic (2)   Shape: not taller-than-wide (0)   Margins: ill-defined (0)   Echogenic foci: none (0)   ACR TI-RADS total points: 4.   ACR TI-RADS risk category: TR4 (4-6 points).   ACR TI-RADS recommendations:   **Given size (>/=  1.5 cm) and appearance, fine needle aspiration of this moderately suspicious nodule should be considered based on TI-RADS criteria.   _________________________________________________________   Nodule # 3:   Location: LEFT; Inferior   Maximum size: 1.3 cm; Other 2 dimensions: 1.2 x 1.0 cm   Composition: solid/almost completely solid (2)   Echogenicity: hypoechoic (2)   Shape: not taller-than-wide (0)   Margins: ill-defined (0)   Echogenic foci: none (0)   ACR TI-RADS total points: 4.   ACR TI-RADS risk category: TR4 (4-6 points).   ACR TI-RADS recommendations:   *Given size (>/= 1 - 1.4 cm) and appearance, a follow-up ultrasound in 1 year should be considered based on TI-RADS criteria.   _________________________________________________________   No cervical adenopathy or abnormal fluid collection within the imaged neck.   IMPRESSION: 1. Enlarged, multinodular thyroid  gland consistent with a goiter. 2. 8.2 cm RIGHT mid TR-3 and 1.9 cm LEFT inferior TR-4 thyroid  nodules. Fine needle aspiration of these nodules should be considered based on TI-RADS criteria. 3. 1.3 cm LEFT inferior TR-4 thyroid  nodule. A follow-up ultrasound in 1 year should be considered based on TI-RADS criteria.   The above is in keeping with the ACR TI-RADS recommendations - J Am Coll Radiol 2017;14:587-595.   - Labs:   Latest Reference Range & Units 02/13/22 08:37 02/13/22 08:42  TSH 0.450 - 4.500 uIU/mL 6.270 (H)   T4,Free (Direct) 0.82 - 1.77 ng/dL 8.77   Thyroperoxidase Ab SerPl-aCnc 0 - 34 IU/mL  <9  (H): Data is abnormally high  REVIEW OF SYSTEMS:  As per history of present illness.   PAST MEDICAL HISTORY: Past Medical History:  Diagnosis Date   Cancer (HCC)    COPD (chronic obstructive pulmonary disease) (HCC)    GERD (gastroesophageal reflux disease)    Heart murmur    Hypertension     PAST SURGICAL HISTORY: Past Surgical History:  Procedure Laterality Date   FRACTURE  SURGERY      ALLERGIES: Not on File  FAMILY HISTORY:  Family History  Problem Relation Age of Onset   Hypertension Father     SOCIAL HISTORY: Social History   Socioeconomic History   Marital status: Married    Spouse name: Rollene   Number of children: 2   Years of education: Not on file   Highest education level: Not on file  Occupational History   Not on file  Tobacco Use   Smoking status: Former    Current packs/day: 0.00    Average packs/day: 1.5 packs/day for 29.0 years (43.5 ttl pk-yrs)    Types: Cigarettes    Start date: 82    Quit date: 2012    Years since quitting: 13.7    Passive exposure: Past   Smokeless tobacco: Never  Vaping Use   Vaping status: Never Used  Substance and Sexual Activity   Alcohol use: Yes    Comment: couple beers a day   Drug use: Never   Sexual activity: Yes    Birth control/protection: None  Other  Topics Concern   Not on file  Social History Narrative   Not on file   Social Drivers of Health   Financial Resource Strain: Low Risk  (10/02/2019)   Received from [facility]   Overall Financial Resource Strain (CARDIA)    Difficulty of Paying Living Expenses: Not hard at all  Food Insecurity: No Food Insecurity (10/02/2019)   [facility] from [facility]   Hunger Vital Sign    Within the past 12 months, you worried that your food would run out before you got the money to buy more.: Never true    Within the past 12 months, the food you bought just didn't last and you didn't have money to get more.: Never true  Transportation Needs: No Transportation Needs (10/02/2019)   Received from [facility] - Transportation    Lack of Transportation (Medical): No    Lack of Transportation (Non-Medical): No  Physical Activity: Inactive (10/02/2019)   Received from [facility]   Exercise Vital Sign    On average, how many days per week do you engage in moderate to strenuous exercise (like a brisk walk)?: 0 days    On  average, how many minutes do you engage in exercise at this level?: 0 min  Stress: No Stress Concern Present (10/02/2019)   Received from [facility] - Occupational Stress Questionnaire    Feeling of Stress : Not at all  Social Connections: Unknown (01/01/2023)   Received from [facility]   Social Network    Social Network: Not on file    MEDICATIONS:  Current Outpatient Medications  Medication Sig Dispense Refill   albuterol  (VENTOLIN  HFA) 108 (90 Base) MCG/ACT inhaler Inhale 1-2 puffs into the lungs every 6 (six) hours as needed for wheezing or shortness of breath. 1 each 0   albuterol  (VENTOLIN  HFA) 108 (90 Base) MCG/ACT inhaler Inhale 2 puffs into the lungs every 6 (six) hours as needed for wheezing or shortness of breath. 34 g 6   amLODipine  (NORVASC ) 10 MG tablet Take 1 tablet (10 mg total) by mouth daily. 90 tablet 1   lisinopril  (ZESTRIL ) 40 MG tablet Take 1 tablet (40 mg total) by mouth daily. 90 tablet 2   metoprolol  tartrate (LOPRESSOR ) 25 MG tablet Take 1 tablet (25 mg total) by mouth 2 (two) times daily. (Patient taking differently: Take 50 mg by mouth daily.) 90 tablet 2   pantoprazole  (PROTONIX ) 40 MG tablet Take 1 tablet (40 mg total) by mouth daily. 90 tablet 2   rosuvastatin  (CRESTOR ) 20 MG tablet Take 1 tablet (20 mg total) by mouth daily. 90 tablet 1   Tiotropium Bromide-Olodaterol (STIOLTO RESPIMAT ) 2.5-2.5 MCG/ACT AERS Inhale 2 puffs into the lungs daily. 12 each 4   No current facility-administered medications for this visit.    PHYSICAL EXAM: Vitals:   01/31/24 0946 01/31/24 0947  BP: (!) 160/100 (!) 150/100  Pulse: 82   Resp: 16   SpO2: 97%   Weight: 224 lb 9.6 oz (101.9 kg)   Height: 5' 9 (1.753 m)    Body mass index is 33.17 kg/m.  Wt Readings from Last 3 Encounters:  01/31/24 224 lb 9.6 oz (101.9 kg)  12/28/23 219 lb (99.3 kg)  12/12/23 221 lb 4.8 oz (100.4 kg)     General: Well developed, well  nourished male in no apparent distress.  HEENT: AT/[place], no external lesions. Hearing intact to the spoken word Eyes: EOMI. No stare,  proptosis. Conjunctiva clear and no icterus. Neck: Trachea leftward shift, neck large visible lump ~ 6 cm, moderately firm, mobile, nontender.  No palpable lymphadenopathy.  Palpable left thyroid  nodule small. Lungs: Clear to auscultation, no wheeze. Respirations not labored Heart: S1S2, Regular in rate and rhythm.  Abdomen: Soft, non tender Neurologic: Alert, oriented, normal speech, deep tendon biceps reflexes normal,  no gross focal neurological deficit Extremities: Trace pedal pitting edema, no tremors of outstretched hands Skin: Warm, color good.  Psychiatric: Does not appear depressed or anxious  PERTINENT HISTORIC LABORATORY AND IMAGING STUDIES:  All pertinent laboratory results were reviewed. Please see HPI also for further details.   TSH  Date Value Ref Range Status  02/13/2022 6.270 (H) 0.450 - 4.500 uIU/mL Final     ASSESSMENT / PLAN  1. Multiple thyroid  nodules   2. Right thyroid  nodule    Patient is known to have goiter/thyroid  nodule for several years, 20+ years was diagnosed before he moved to this area when he was in [place] .  Patient reports he had renal biopsy during the time of diagnosis 20+ years ago and cytology was reportedly benign.  He has noticed gradually increasing right neck lump over 7 to 8 years.  Ultrasound thyroid  in October 2023 showed right thyroid  nodule measuring 8.2 cm and left thyroid  nodule measuring 1.9 and 1.3 cm.  CT scan showed neck compressive features with leftward shift of trachea with compressive features.  Patient has neck compressive features/symptoms with dysphagia, difficulty breathing in left lateral position.  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  Plan: - Will check ultrasound thyroid /soft tissue neck to evaluate current status of bilateral thyroid   nodules including left thyroid  nodules. - Will refer to IR after ultrasound thyroid  is completed.  We definitely refer for FNA of right thyroid  nodule and based on ultrasound finding we will also plan for FNA of left thyroid  nodule. - Patient needs thyroid  surgery possibly total thyroidectomy, irrespective of cytology report due to neck compressive symptoms, gradual concerning enlargement of nodule over time.  Will refer to general surgery Dr. Eletha.  Patient agreed with the plan for thyroid  surgery, he prefers to complete surgery this year.   - Check thyroid  function test.  He had elevated TSH in June.  He is mildly hypothyroid clinically in the clinic today.  Will determine plan for thyroid  hormone replacement based on lab result from today.  Diagnoses and all orders for this visit:  Multiple thyroid  nodules -     TSH -     T4, free -     US  THYROID ; Future -     Ambulatory referral to General Surgery  Right thyroid  nodule -     TSH -     T4, free -     US  THYROID ; Future -     Ambulatory referral to General Surgery    DISPOSITION Follow up in clinic in 3 months suggested.  All questions answered and patient verbalized understanding of the plan.  Kyle Sherena Machorro, MD [facility] Endocrinology [facility] [address] [place], [address] [place], [place] [address] Phone # [phone]  At least part of this note was generated using voice recognition software. Inadvertent word errors may have occurred, which were not recognized during the proofreading process.

## 2024-01-31 NOTE — Patient Instructions (Signed)
 Plan for ultrasound and needle biopsy.  I have referred to surgery as well.

## 2024-02-01 ENCOUNTER — Other Ambulatory Visit: Payer: Self-pay

## 2024-02-01 ENCOUNTER — Ambulatory Visit
Admission: RE | Admit: 2024-02-01 | Discharge: 2024-02-01 | Disposition: A | Discharge status: Home / Self Care | Source: Ambulatory Visit | Attending: Endocrinology | Admitting: Endocrinology

## 2024-02-01 ENCOUNTER — Ambulatory Visit: Payer: Self-pay | Admitting: Endocrinology

## 2024-02-01 DIAGNOSIS — Z122 Encounter for screening for malignant neoplasm of respiratory organs: Secondary | ICD-10-CM

## 2024-02-01 DIAGNOSIS — Z87891 Personal history of nicotine dependence: Secondary | ICD-10-CM

## 2024-02-01 DIAGNOSIS — E041 Nontoxic single thyroid nodule: Secondary | ICD-10-CM

## 2024-02-01 DIAGNOSIS — E042 Nontoxic multinodular goiter: Secondary | ICD-10-CM | POA: Diagnosis not present

## 2024-02-04 ENCOUNTER — Other Ambulatory Visit: Payer: Self-pay | Admitting: Endocrinology

## 2024-02-04 DIAGNOSIS — E041 Nontoxic single thyroid nodule: Secondary | ICD-10-CM

## 2024-02-04 DIAGNOSIS — E042 Nontoxic multinodular goiter: Secondary | ICD-10-CM

## 2024-02-11 ENCOUNTER — Other Ambulatory Visit (HOSPITAL_BASED_OUTPATIENT_CLINIC_OR_DEPARTMENT_OTHER): Payer: Self-pay | Admitting: *Deleted

## 2024-02-11 MED ORDER — LISINOPRIL 40 MG PO TABS: 40.0000 mg | ORAL_TABLET | Freq: Every day | ORAL | 2 refills | Status: AC

## 2024-02-11 MED ORDER — AMLODIPINE BESYLATE 10 MG PO TABS: 10.0000 mg | ORAL_TABLET | Freq: Every day | ORAL | 1 refills | Status: AC

## 2024-02-11 MED ORDER — ROSUVASTATIN CALCIUM 20 MG PO TABS: 20.0000 mg | ORAL_TABLET | Freq: Every day | ORAL | 1 refills | Status: AC

## 2024-02-15 ENCOUNTER — Other Ambulatory Visit (HOSPITAL_COMMUNITY)
Admission: RE | Admit: 2024-02-15 | Discharge: 2024-02-15 | Disposition: A | Discharge status: Home / Self Care | Source: Ambulatory Visit | Attending: Endocrinology | Admitting: Endocrinology

## 2024-02-15 ENCOUNTER — Ambulatory Visit
Admission: RE | Admit: 2024-02-15 | Discharge: 2024-02-15 | Disposition: A | Discharge status: Home / Self Care | Source: Ambulatory Visit | Attending: Endocrinology | Admitting: Endocrinology

## 2024-02-15 ENCOUNTER — Ambulatory Visit: Payer: Self-pay | Admitting: Endocrinology

## 2024-02-15 DIAGNOSIS — E042 Nontoxic multinodular goiter: Secondary | ICD-10-CM | POA: Diagnosis not present

## 2024-02-15 DIAGNOSIS — E041 Nontoxic single thyroid nodule: Secondary | ICD-10-CM

## 2024-02-18 ENCOUNTER — Ambulatory Visit: Payer: Self-pay | Admitting: Endocrinology

## 2024-02-18 LAB — CYTOLOGY - NON PAP

## 2024-02-25 ENCOUNTER — Other Ambulatory Visit (HOSPITAL_BASED_OUTPATIENT_CLINIC_OR_DEPARTMENT_OTHER): Payer: Self-pay | Admitting: *Deleted

## 2024-02-25 MED ORDER — METOPROLOL TARTRATE 25 MG PO TABS: 25.0000 mg | ORAL_TABLET | Freq: Two times a day (BID) | ORAL | 3 refills | Status: AC

## 2024-03-11 DIAGNOSIS — E042 Nontoxic multinodular goiter: Secondary | ICD-10-CM | POA: Diagnosis not present

## 2024-03-11 DIAGNOSIS — J398 Other specified diseases of upper respiratory tract: Secondary | ICD-10-CM | POA: Diagnosis not present

## 2024-03-11 DIAGNOSIS — E049 Nontoxic goiter, unspecified: Secondary | ICD-10-CM | POA: Diagnosis not present

## 2024-03-12 ENCOUNTER — Other Ambulatory Visit: Payer: Self-pay | Admitting: Surgery

## 2024-03-12 DIAGNOSIS — E049 Nontoxic goiter, unspecified: Secondary | ICD-10-CM

## 2024-03-12 DIAGNOSIS — J398 Other specified diseases of upper respiratory tract: Secondary | ICD-10-CM

## 2024-03-21 ENCOUNTER — Ambulatory Visit
Admission: RE | Admit: 2024-03-21 | Discharge: 2024-03-21 | Disposition: A | Discharge status: Home / Self Care | Source: Ambulatory Visit | Attending: Surgery | Admitting: Surgery

## 2024-03-21 DIAGNOSIS — E049 Nontoxic goiter, unspecified: Secondary | ICD-10-CM

## 2024-03-21 DIAGNOSIS — J398 Other specified diseases of upper respiratory tract: Secondary | ICD-10-CM

## 2024-03-21 MED ORDER — IOPAMIDOL (ISOVUE-300) INJECTION 61%
75.0000 mL | Freq: Once | INTRAVENOUS | Status: AC | PRN
  Administered 2024-03-21: 75 mL via INTRAVENOUS

## 2024-03-24 DIAGNOSIS — D0422 Carcinoma in situ of skin of left ear and external auricular canal: Secondary | ICD-10-CM | POA: Diagnosis not present

## 2024-03-24 DIAGNOSIS — D1801 Hemangioma of skin and subcutaneous tissue: Secondary | ICD-10-CM | POA: Diagnosis not present

## 2024-03-24 DIAGNOSIS — L814 Other melanin hyperpigmentation: Secondary | ICD-10-CM | POA: Diagnosis not present

## 2024-03-24 DIAGNOSIS — L821 Other seborrheic keratosis: Secondary | ICD-10-CM | POA: Diagnosis not present

## 2024-03-24 DIAGNOSIS — D485 Neoplasm of uncertain behavior of skin: Secondary | ICD-10-CM | POA: Diagnosis not present

## 2024-03-27 ENCOUNTER — Ambulatory Visit: Admitting: "Endocrinology

## 2024-03-28 ENCOUNTER — Encounter (HOSPITAL_BASED_OUTPATIENT_CLINIC_OR_DEPARTMENT_OTHER): Payer: Self-pay | Admitting: Family Medicine

## 2024-03-28 ENCOUNTER — Ambulatory Visit (INDEPENDENT_AMBULATORY_CARE_PROVIDER_SITE_OTHER): Admitting: Family Medicine

## 2024-03-28 VITALS — BP 135/87 | HR 77 | Wt 225.0 lb

## 2024-03-28 DIAGNOSIS — Z0000 Encounter for general adult medical examination without abnormal findings: Secondary | ICD-10-CM | POA: Diagnosis not present

## 2024-03-28 NOTE — Progress Notes (Signed)
 Subjective:    CC: Annual Physical Exam  HPI:  Kyle Mcdonald. is a 58 y.o. presenting for annual physical  I reviewed the past medical history, family history, social history, surgical history, and allergies today and no changes were needed.  Please see the problem list section below in epic for further details.  Past Medical History: Past Medical History:  Diagnosis Date   Cancer (HCC)    COPD (chronic obstructive pulmonary disease) (HCC)    GERD (gastroesophageal reflux disease)    Heart murmur    Hypertension    Past Surgical History: Past Surgical History:  Procedure Laterality Date   FRACTURE SURGERY     Social History: Social History   Socioeconomic History   Marital status: Married    Spouse name: Rollene   Number of children: 2   Years of education: Not on file   Highest education level: Not on file  Occupational History   Not on file  Tobacco Use   Smoking status: Former    Current packs/day: 0.00    Average packs/day: 1.5 packs/day for 29.0 years (43.5 ttl pk-yrs)    Types: Cigarettes    Start date: 70    Quit date: 2012    Years since quitting: 13.8    Passive exposure: Past   Smokeless tobacco: Never  Vaping Use   Vaping status: Never Used  Substance and Sexual Activity   Alcohol use: Yes    Comment: couple beers a day   Drug use: Never   Sexual activity: Yes    Birth control/protection: None  Other Topics Concern   Not on file  Social History Narrative   Not on file   Social Drivers of Health   Financial Resource Strain: Low Risk  (10/02/2019)   Received from [facility]   Overall Financial Resource Strain (CARDIA)    Difficulty of Paying Living Expenses: Not hard at all  Food Insecurity: No Food Insecurity (10/02/2019)   Received from [facility]   Hunger Vital Sign    Within the past 12 months, you worried that your food would run out before you got the money to buy more.: Never true    Within the past 12 months, the  food you bought just didn't last and you didn't have money to get more.: Never true  Transportation Needs: No Transportation Needs (10/02/2019)   Received from [facility] - Transportation    Lack of Transportation (Medical): No    Lack of Transportation (Non-Medical): No  Physical Activity: Inactive (10/02/2019)   Received from [facility]   Exercise Vital Sign    On average, how many days per week do you engage in moderate to strenuous exercise (like a brisk walk)?: 0 days    On average, how many minutes do you engage in exercise at this level?: 0 min  Stress: No Stress Concern Present (10/02/2019)   Received from [facility] - Occupational Stress Questionnaire    Feeling of Stress : Not at all  Social Connections: Unknown (01/01/2023)   Received from [facility]   Social Network    Social Network: Not on file   Family History: Family History  Problem Relation Age of Onset   Hypertension Father    Allergies: No Known Allergies Medications: See med rec.  Review of Systems: No headache, visual changes, nausea, vomiting, diarrhea, constipation, dizziness, abdominal pain, skin rash, fevers, chills, night sweats, swollen lymph nodes, weight  loss, chest pain, body aches, joint swelling, muscle aches, shortness of breath, mood changes, visual or auditory hallucinations.  Objective:    BP 135/87 (BP Location: Left Arm, Patient Position: Sitting, Cuff Size: Normal)   Pulse 77   Wt 225 lb (102.1 kg)   SpO2 97%   BMI 33.23 kg/m   General: Well Developed, well nourished, and in no acute distress.  Neuro: Alert and oriented x3, extra-ocular muscles intact, sensation grossly intact. Cranial nerves II through XII are intact, motor, sensory, and coordinative functions are all intact. HEENT: Normocephalic, atraumatic, pupils equal round reactive to light, neck supple, no masses, no lymphadenopathy, thyroid  notably enlarged.  Oropharynx, nasopharynx, external ear canals are unremarkable. Skin: Warm and dry, no rashes noted.  Cardiac: Regular rate and rhythm, systolic murmur present.  Respiratory: Clear to auscultation bilaterally. Not using accessory muscles, speaking in full sentences.  Abdominal: Soft, nontender, nondistended, positive bowel sounds, no masses, no organomegaly.  Musculoskeletal: Shoulder, elbow, wrist, hip, knee, ankle stable, and with full range of motion.  Impression and Recommendations:    Wellness examination Assessment & Plan: Routine HCM labs ordered. HCM reviewed/discussed. Anticipatory guidance regarding healthy weight, lifestyle and choices given. Recommend healthy diet.  Recommend approximately 150 minutes/week of moderate intensity exercise Recommend regular dental and vision exams Always use seatbelt/lap and shoulder restraints Recommend using smoke alarms and checking batteries at least twice a year Recommend using sunscreen when outside Discussed colon cancer screening recommendations, options.  Patient has had colonoscopy with [facility] - next due 2033 Discussed recommendations for shingles vaccine.  Patient declines at this time Discussed immunization recommendations  Orders: -     Lipid panel -     Hemoglobin A1c -     Comprehensive metabolic panel with GFR -     CBC with Differential/Platelet  Return in about 4 months (around 07/26/2024) for hypertension.   ___________________________________________ Kyle Nill de [place], MD, ABFM, CAQSM Primary Care and Sports Medicine [facility]

## 2024-03-28 NOTE — Assessment & Plan Note (Signed)
 Routine HCM labs ordered. HCM reviewed/discussed. Anticipatory guidance regarding healthy weight, lifestyle and choices given. Recommend healthy diet.  Recommend approximately 150 minutes/week of moderate intensity exercise Recommend regular dental and vision exams Always use seatbelt/lap and shoulder restraints Recommend using smoke alarms and checking batteries at least twice a year Recommend using sunscreen when outside Discussed colon cancer screening recommendations, options.  Patient has had colonoscopy with [facility] - next due 2033 Discussed recommendations for shingles vaccine.  Patient declines at this time Discussed immunization recommendations

## 2024-03-29 LAB — CBC WITH DIFFERENTIAL/PLATELET
Basophils Absolute: 0.1 x10E3/uL (ref 0.0–0.2)
Basos: 1 %
EOS (ABSOLUTE): 0.1 x10E3/uL (ref 0.0–0.4)
Eos: 2 %
Hematocrit: 47.4 % (ref 37.5–51.0)
Hemoglobin: 16 g/dL (ref 13.0–17.7)
Immature Grans (Abs): 0 x10E3/uL (ref 0.0–0.1)
Immature Granulocytes: 0 %
Lymphocytes Absolute: 1.2 x10E3/uL (ref 0.7–3.1)
Lymphs: 23 %
MCH: 31.1 pg (ref 26.6–33.0)
MCHC: 33.8 g/dL (ref 31.5–35.7)
MCV: 92 fL (ref 79–97)
Monocytes Absolute: 0.7 x10E3/uL (ref 0.1–0.9)
Monocytes: 12 %
Neutrophils Absolute: 3.3 x10E3/uL (ref 1.4–7.0)
Neutrophils: 62 %
Platelets: 174 x10E3/uL (ref 150–450)
RBC: 5.15 x10E6/uL (ref 4.14–5.80)
RDW: 12.4 % (ref 11.6–15.4)
WBC: 5.3 x10E3/uL (ref 3.4–10.8)

## 2024-03-29 LAB — LIPID PANEL
Chol/HDL Ratio: 2.3 ratio (ref 0.0–5.0)
Cholesterol, Total: 86 mg/dL — ABNORMAL LOW (ref 100–199)
HDL: 38 mg/dL — ABNORMAL LOW (ref >39)
LDL Chol Calc (NIH): 33 mg/dL (ref 0–99)
Triglycerides: 65 mg/dL (ref 0–149)
VLDL Cholesterol Cal: 15 mg/dL (ref 5–40)

## 2024-03-29 LAB — COMPREHENSIVE METABOLIC PANEL WITH GFR
ALT: 44 IU/L (ref 0–44)
AST: 26 IU/L (ref 0–40)
Albumin: 4.4 g/dL (ref 3.8–4.9)
Alkaline Phosphatase: 92 IU/L (ref 47–123)
BUN/Creatinine Ratio: 18 (ref 9–20)
BUN: 18 mg/dL (ref 6–24)
Bilirubin Total: 0.7 mg/dL (ref 0.0–1.2)
CO2: 22 mmol/L (ref 20–29)
Calcium: 8.6 mg/dL — ABNORMAL LOW (ref 8.7–10.2)
Chloride: 105 mmol/L (ref 96–106)
Creatinine, Ser: 0.98 mg/dL (ref 0.76–1.27)
Globulin, Total: 2.2 g/dL (ref 1.5–4.5)
Glucose: 125 mg/dL — ABNORMAL HIGH (ref 70–99)
Potassium: 4.1 mmol/L (ref 3.5–5.2)
Sodium: 139 mmol/L (ref 134–144)
Total Protein: 6.6 g/dL (ref 6.0–8.5)
eGFR: 89 mL/min/1.73 (ref >59)

## 2024-03-29 LAB — HEMOGLOBIN A1C
Est. average glucose Bld gHb Est-mCnc: 120 mg/dL
Hgb A1c MFr Bld: 5.8 % — ABNORMAL HIGH (ref 4.8–5.6)

## 2024-03-31 ENCOUNTER — Ambulatory Visit: Payer: Self-pay | Admitting: Surgery

## 2024-03-31 NOTE — Progress Notes (Signed)
 CT scan of neck obtained and images reviewed.  Large right thyroid  nodule with tracheal deviation approx 2.6 cm.  Will forward to anesthesiology for their review.  Confined to the neck.  No obvious tracheal narrowing.  Will plan to proceed with total thyroidectomy as we discussed in the office.  Will enter orders and ask schedulers to contact the patient and schedule his procedure for December 2025 at the patient's request.  Krystal Spinner, MD [facility] practice Office: [phone]

## 2024-04-02 ENCOUNTER — Ambulatory Visit (HOSPITAL_BASED_OUTPATIENT_CLINIC_OR_DEPARTMENT_OTHER): Payer: Self-pay | Admitting: Family Medicine

## 2024-04-08 NOTE — Progress Notes (Deleted)
 Established Patient Pulmonology Office Visit   Subjective:  Patient ID: Kyle Boedecker., male    DOB: 1965-07-02  MRN: [id number]  CC: No chief complaint on file.   HPI  Kyle Mcdonald. is a 58 y.o. 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  Last seen on 12/28/2023, at the time advised to transition from [facility] to [facility]. I also recommended lung cancer screening and ordered PFTs. I advised him to meet with endocrinology and gen surg to discuss removal of goiter. He did meet with Dr. Eletha who recommends total thyroidectomy.    {PULM QUESTIONNAIRES (Optional):33196}  ROS  {History (Optional):23778}  Current Outpatient Medications:    albuterol  (VENTOLIN  HFA) 108 (90 Base) MCG/ACT inhaler, Inhale 2 puffs into the lungs every 6 (six) hours as needed for wheezing or shortness of breath., Disp: 34 g, Rfl: 6   amLODipine  (NORVASC ) 10 MG tablet, Take 1 tablet (10 mg total) by mouth daily., Disp: 90 tablet, Rfl: 1   clobetasol cream (TEMOVATE) 0.05 %, Apply 1 Application topically daily as needed (spots on skin)., Disp: , Rfl:    Fluticasone-Umeclidin-Vilant (TRELEGY ELLIPTA ) 100-62.5-25 MCG/ACT AEPB, Inhale 2 puffs into the lungs daily., Disp: , Rfl:    lisinopril  (ZESTRIL ) 40 MG tablet, Take 1 tablet (40 mg total) by mouth daily., Disp: 90 tablet, Rfl: 2   metoprolol  tartrate (LOPRESSOR ) 25 MG tablet, Take 1 tablet (25 mg total) by mouth 2 (two) times daily. (Patient taking differently: Take 50 mg by mouth in the morning.), Disp: 180 tablet, Rfl: 3   pantoprazole  (PROTONIX ) 40 MG tablet, Take 1 tablet (40 mg total) by mouth daily., Disp: 90 tablet, Rfl: 2   rosuvastatin  (CRESTOR ) 20 MG tablet, Take 1 tablet (20 mg total) by mouth daily., Disp: 90 tablet, Rfl: 1   Tiotropium Bromide-Olodaterol (STIOLTO RESPIMAT ) 2.5-2.5 MCG/ACT AERS, Inhale 2 puffs into the lungs daily. (Patient not taking: Reported on  04/07/2024), Disp: 12 each, Rfl: 4      Objective:  There were no vitals taken for this visit. {Pulm Vitals (Optional):32837}  Physical Exam   Diagnostic Review:  {Labs (Optional):32838}  CTA Chest 06/2022 with RUL atelectasis, mild bilateral upper lobe emphysematous changes, small calcified RLL solitary pulmonary nodule.   LDCT 01/2024: Mediastinum/Nodes: Enlarged, heterogeneous and partially imaged right thyroid  mass, 6.4 x 7.0 cm. Slight leftward deviation of the trachea. 12 mm low right paratracheal lymph node, similar. No pathologically enlarged mediastinal or axillary lymph nodes. Hilar regions are difficult to definitively evaluate without IV contrast. Esophagus is grossly unremarkable.   Lungs/Pleura: Centrilobular and paraseptal emphysema. Scarring and volume loss in the right upper lobe, posterior left upper lobe and medial aspects of both lower lobes. Pulmonary nodules measure 5.1 mm or less in size. No pleural fluid. Airway is unremarkable. Calcified granulomas.  IMPRESSION: 1. Lung-RADS 2, benign appearance or behavior. Continue annual screening with low-dose chest CT without contrast in 12 months. 2. Age advanced coronary artery calcification. 3. Partially imaged enlarged heterogeneous right lobe of the thyroid . Previous thyroid  ultrasound 03/06/2022, at which time biopsy was recommended. Recommend thyroid  ultrasound. (Ref: J Am Coll Radiol. 2015 Feb;12(2): 143-50). 4.  Aortic atherosclerosis (ICD10-I70.0). 5. Enlarged pulmonic trunk, indicative of pulmonary arterial hypertension. 6.  Emphysema (ICD10-J43.9).  CT neck 03/2024: IMPRESSION: 1. Markedly enlarged right thyroid  lobe due to a 5.9 x 5.9 x 8.1 cm heterogeneous nodule causing 2.6 cm of leftward tracheal deviation without  significant tracheal compression. 2. Pulmonary mosaic attenuation and atelectasis/scarring. Consider CT chest for further evaluation if clinically desired.    Assessment & Plan:    Assessment & Plan   No orders of the defined types were placed in this encounter.     No follow-ups on file.   Kyle Hentz, MD

## 2024-04-09 ENCOUNTER — Encounter (HOSPITAL_BASED_OUTPATIENT_CLINIC_OR_DEPARTMENT_OTHER)

## 2024-04-09 ENCOUNTER — Ambulatory Visit (HOSPITAL_BASED_OUTPATIENT_CLINIC_OR_DEPARTMENT_OTHER): Admitting: Pulmonary Disease

## 2024-04-10 NOTE — Progress Notes (Addendum)
 PCP - Raymond de Cuba, MD lov 03-28-24 epic Cardiologist - n/a  PPM/ICD -  Device Orders -  Rep Notified -   Chest x-ray - CT chest 01-28-24  epic EKG - preop Stress Test -  ECHO -  Cardiac Cath -   Sleep Study - n/a CPAP - n/a  Fasting Blood Sugar -  Checks Blood Sugar __n/a___ times a day  Blood Thinner Instructions:n/a Aspirin Instructions:n/a  ERAS Protcol - PRE-SURGERY n/a    COVID vaccine -yes  Activity--Able to climb a flight of stairs with no CP or SOB  Anesthesia review: murmur no echo, Swelling of neck causing trouble lying flat, swallowing liquids and solids. Trouble breathing when lying on left side. 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 Patient agrees to go over the instructions while at home for a better understanding. Patient also instructed to self quarantine after being tested for COVID-19. The opportunity to ask questions was provided.

## 2024-04-10 NOTE — Patient Instructions (Addendum)
 SURGICAL WAITING ROOM VISITATION  Patients having surgery or a procedure may have no more than 2 support people in the waiting area - these visitors may rotate.    Children under the age of 25 must have an adult with them who is not the patient.  Visitors with respiratory illnesses are discouraged from visiting and should remain at home.  If the patient needs to stay at the hospital during part of their recovery, the visitor guidelines for inpatient rooms apply. Pre-op nurse will coordinate an appropriate time for 1 support person to accompany patient in pre-op.  This support person may not rotate.    Please refer to the [facility] website for the visitor guidelines for Inpatients (after your surgery is over and you are in a regular room).       Your procedure is scheduled on: 04-28-24   Report to [facility] Main Entrance    Report to admitting at       0820 AM   Call this number if you have problems the morning of surgery [phone]   Do not eat food :After Midnight.   After Midnight you may have the following liquids until _0735_____ AM/ DAY OF SURGERY  then nothing by mouth  Water Non-Citrus Juices (without pulp, NO RED-Apple, White grape, White cranberry) Black Coffee (NO MILK/CREAM OR CREAMERS, sugar ok)  Clear Tea (NO MILK/CREAM OR CREAMERS, sugar ok) regular and decaf                             Plain Jell-O (NO RED)                                           Fruit ices (not with fruit pulp, NO RED)                                     Popsicles (NO RED)                                                               Sports drinks like Gatorade (NO RED)                            If you have questions, please contact your surgeon's office.   FOLLOW  ANY ADDITIONAL PRE OP INSTRUCTIONS YOU RECEIVED FROM YOUR SURGEON'S OFFICE!!!     Oral Hygiene is also important to reduce your risk of infection.                                    Remember - BRUSH YOUR TEETH  THE MORNING OF SURGERY WITH YOUR REGULAR TOOTHPASTE  DENTURES WILL BE REMOVED PRIOR TO SURGERY PLEASE DO NOT APPLY Poly grip OR ADHESIVES!!!   Do NOT smoke after Midnight   Stop all vitamins and herbal supplements 7 days before surgery.   Take these medicines the morning of surgery with A SIP OF WATER: inhalers and  bring rescue inhaler, rosuvastatin , metoprolol , pantoprazole , amlodipine     Bring CPAP mask and tubing day of surgery.                              You may not have any metal on your body including hair pins, jewelry, and body piercing             Do not wear lotions, powders, /cologne, or deodorant                Men may shave face and neck.   Do not bring valuables to the hospital. [facility] IS NOT             RESPONSIBLE   FOR VALUABLES.   Contacts, glasses, dentures or bridgework may not be worn into surgery.   Bring small overnight bag day of surgery.   DO NOT BRING YOUR HOME MEDICATIONS TO THE HOSPITAL. PHARMACY WILL DISPENSE MEDICATIONS LISTED ON YOUR MEDICATION LIST TO YOU DURING YOUR ADMISSION IN THE HOSPITAL!    Patients discharged on the day of surgery will not be allowed to drive home.  Someone NEEDS to stay with you for the first 24 hours after anesthesia.   Special Instructions: Bring a copy of your healthcare power of attorney and living will documents the day of surgery if you haven't scanned them before.              Please read over the following fact sheets you were given: IF YOU HAVE QUESTIONS ABOUT YOUR PRE-OP INSTRUCTIONS PLEASE CALL [phone].   If you test positive for Covid or have been in contact with anyone that has tested positive in the last 10 days please notify you surgeon.  [place] [facility] - Preparing for Surgery Before surgery, you can play an important role.  Because skin is not sterile, your skin needs to be as free of germs as possible.  You can reduce the number of germs on your skin by washing with CHG (chlorahexidine  gluconate) soap before surgery.  CHG is an antiseptic cleaner which kills germs and bonds with the skin to continue killing germs even after washing. Please DO NOT use if you have an allergy to CHG or antibacterial soaps.  If your skin becomes reddened/irritated stop using the CHG and inform your nurse when you arrive at Short Stay. Do not shave (including legs and underarms) for at least 48 hours prior to the first CHG shower.  You may shave your face/neck.  Please follow these instructions carefully:  1.  Shower with CHG Soap the night before surgery ONLY (DO NOT USE THE SOAP THE MORNING OF SURGERY).  2.  If you choose to wash your hair, wash your hair first as usual with your normal  shampoo.  3.  After you shampoo, rinse your hair and body thoroughly to remove the shampoo.                             4.  Use CHG as you would any other liquid soap.  You can apply chg directly to the skin and wash.  Gently with a scrungie or clean washcloth.  5.  Apply the CHG Soap to your body ONLY FROM THE NECK DOWN.   Do  not use on face/ open  Wound or open sores. Avoid contact with eyes, ears mouth and genitals (private parts).                       Wash face,  Genitals (private parts) with your normal soap.             6.  Wash thoroughly, paying special attention to the area where your  surgery  will be performed.  7.  Thoroughly rinse your body with warm water from the neck down.  8.  DO NOT shower/wash with your normal soap after using and rinsing off the CHG Soap.                9.  Pat yourself dry with a clean towel.            10.  Wear clean pajamas.            11.  Place clean sheets on your bed the night of your first shower and do not  sleep with pets. Day of Surgery : Do not apply any CHG, lotions/deodorants the morning of surgery.  Please wear clean clothes to the hospital/surgery center.  FAILURE TO FOLLOW THESE INSTRUCTIONS MAY RESULT IN THE CANCELLATION OF YOUR  SURGERY  PATIENT SIGNATURE_________________________________  NURSE SIGNATURE__________________________________  ________________________________________________________________________

## 2024-04-11 ENCOUNTER — Encounter (HOSPITAL_COMMUNITY): Admission: RE | Admit: 2024-04-11 | Discharge: 2024-04-11 | Attending: Surgery | Admitting: Surgery

## 2024-04-11 ENCOUNTER — Encounter (HOSPITAL_COMMUNITY): Payer: Self-pay

## 2024-04-11 ENCOUNTER — Other Ambulatory Visit: Payer: Self-pay

## 2024-04-11 VITALS — BP 149/83 | HR 72 | Temp 98.0°F | Resp 16 | Ht 69.0 in | Wt 223.0 lb

## 2024-04-11 DIAGNOSIS — J9811 Atelectasis: Secondary | ICD-10-CM | POA: Diagnosis not present

## 2024-04-11 DIAGNOSIS — I10 Essential (primary) hypertension: Secondary | ICD-10-CM

## 2024-04-11 DIAGNOSIS — J398 Other specified diseases of upper respiratory tract: Secondary | ICD-10-CM | POA: Diagnosis not present

## 2024-04-11 DIAGNOSIS — Z87891 Personal history of nicotine dependence: Secondary | ICD-10-CM | POA: Diagnosis not present

## 2024-04-11 DIAGNOSIS — Z8572 Personal history of non-Hodgkin lymphomas: Secondary | ICD-10-CM | POA: Diagnosis not present

## 2024-04-11 DIAGNOSIS — J449 Chronic obstructive pulmonary disease, unspecified: Secondary | ICD-10-CM | POA: Diagnosis not present

## 2024-04-11 DIAGNOSIS — E041 Nontoxic single thyroid nodule: Secondary | ICD-10-CM | POA: Diagnosis not present

## 2024-04-11 DIAGNOSIS — Z01818 Encounter for other preprocedural examination: Secondary | ICD-10-CM | POA: Diagnosis not present

## 2024-04-11 DIAGNOSIS — K219 Gastro-esophageal reflux disease without esophagitis: Secondary | ICD-10-CM | POA: Diagnosis not present

## 2024-04-11 HISTORY — DX: Headache, unspecified: R51.9

## 2024-04-11 HISTORY — DX: Nontoxic single thyroid nodule: E04.1

## 2024-04-11 HISTORY — DX: Pneumonia, unspecified organism: J18.9

## 2024-04-11 LAB — BASIC METABOLIC PANEL WITH GFR
Anion gap: 10 (ref 5–15)
BUN: 14 mg/dL (ref 6–20)
CO2: 24 mmol/L (ref 22–32)
Calcium: 8.9 mg/dL (ref 8.9–10.3)
Chloride: 106 mmol/L (ref 98–111)
Creatinine, Ser: 1.02 mg/dL (ref 0.61–1.24)
GFR, Estimated: >60 mL/min (ref >60)
Glucose, Bld: 145 mg/dL — ABNORMAL HIGH (ref 70–99)
Potassium: 4.4 mmol/L (ref 3.5–5.1)
Sodium: 140 mmol/L (ref 135–145)

## 2024-04-11 LAB — CBC
HCT: 47.3 % (ref 39.0–52.0)
Hemoglobin: 16.2 g/dL (ref 13.0–17.0)
MCH: 30.7 pg (ref 26.0–34.0)
MCHC: 34.2 g/dL (ref 30.0–36.0)
MCV: 89.8 fL (ref 80.0–100.0)
Platelets: 169 K/uL (ref 150–400)
RBC: 5.27 MIL/uL (ref 4.22–5.81)
RDW: 12.7 % (ref 11.5–15.5)
WBC: 6.3 K/uL (ref 4.0–10.5)
nRBC: 0 % (ref 0.0–0.2)

## 2024-04-14 NOTE — Anesthesia Preprocedure Evaluation (Signed)
 Anesthesia Evaluation    Airway        Dental   Pulmonary former smoker          Cardiovascular hypertension,      Neuro/Psych    GI/Hepatic   Endo/Other    Renal/GU      Musculoskeletal   Abdominal   Peds  Hematology   Anesthesia Other Findings   Reproductive/Obstetrics                              Anesthesia Physical Anesthesia Plan  ASA:   Anesthesia Plan:    Post-op Pain Management:    Induction:   PONV Risk Score and Plan:   Airway Management Planned:   Additional Equipment:   Intra-op Plan:   Post-operative Plan:   Informed Consent:   Plan Discussed with:   Anesthesia Plan Comments: (See PAT note 04/11/2024)        Anesthesia Quick Evaluation

## 2024-04-14 NOTE — Progress Notes (Signed)
 Anesthesia Chart Review   Case: [id number] Date/Time: 04/28/24 1022   Procedure: THYROIDECTOMY - TOTAL THYROIDECTOMY   Anesthesia type: General   Pre-op diagnosis: RIGHT THYROID  NODULE, TRACHEAL DEVIATION   Location: [facility] ROOM 01 / [facility]   Surgeons: Eletha Boas, MD       DISCUSSION:58 y.o. former smoker with h/o GERD, COPD, HTN, non-Hodgkin's Lymphoma, right thyroid  nodule with tracheal deviation scheduled for above procedure 04/28/24 with Dr. Susan Eletha.   Pt does report difficulty breathing when lying on left lateral side.  Reports intermittent dysphagia.  Denies voice change.   CT 03/24/2024 IMPRESSION: 1. Markedly enlarged right thyroid  lobe due to a 5.9 x 5.9 x 8.1 cm heterogeneous nodule causing 2.6 cm of leftward tracheal deviation without significant tracheal compression. 2. Pulmonary mosaic attenuation and atelectasis/scarring. Consider CT chest for further evaluation if clinically desired.  Pt reports he can climb a flight of stairs without chest pain or shortness of breath. EKG with no acute findings.   VS: BP (!) 149/83   Pulse 72   Temp 36.7 C (Oral)   Resp 16   Ht 5' 9 (1.753 m)   Wt 101.2 kg   SpO2 99%   BMI 32.93 kg/m   PROVIDERS: de Cuba, Quintin PARAS, MD is PCP    LABS: Labs reviewed: Acceptable for surgery. (all labs ordered are listed, but only abnormal results are displayed)  Labs Reviewed  BASIC METABOLIC PANEL WITH GFR - Abnormal; Notable for the following components:      Result Value   Glucose, Bld 145 (*)    All other components within normal limits  CBC     IMAGES:   EKG:   CV:  Past Medical History:  Diagnosis Date   Cancer (HCC)    non hodgkins lymphoma 58 yr old   COPD (chronic obstructive pulmonary disease) (HCC)    GERD (gastroesophageal reflux disease)    Headache    occ. migraine  silent   Heart murmur    Hypertension    Pneumonia    Thyroid  nodule     Past Surgical History:  Procedure Laterality Date    FRACTURE SURGERY Left    wrist  11 screws and a plate    MEDICATIONS:  albuterol  (VENTOLIN  HFA) 108 (90 Base) MCG/ACT inhaler   amLODipine  (NORVASC ) 10 MG tablet   clobetasol cream (TEMOVATE) 0.05 %   Fluticasone-Umeclidin-Vilant (TRELEGY ELLIPTA ) 100-62.5-25 MCG/ACT AEPB   lisinopril  (ZESTRIL ) 40 MG tablet   metoprolol  tartrate (LOPRESSOR ) 25 MG tablet   pantoprazole  (PROTONIX ) 40 MG tablet   rosuvastatin  (CRESTOR ) 20 MG tablet   Tiotropium Bromide-Olodaterol (STIOLTO RESPIMAT ) 2.5-2.5 MCG/ACT AERS   No current facility-administered medications for this encounter.    Harlene Hoots Ward, PA-C [facility] Pre-Surgical Testing [phone]

## 2024-04-25 ENCOUNTER — Encounter (HOSPITAL_COMMUNITY): Payer: Self-pay | Admitting: Surgery

## 2024-04-25 ENCOUNTER — Ambulatory Visit (HOSPITAL_BASED_OUTPATIENT_CLINIC_OR_DEPARTMENT_OTHER): Admitting: Pulmonary Disease

## 2024-04-25 ENCOUNTER — Encounter

## 2024-04-25 DIAGNOSIS — D044 Carcinoma in situ of skin of scalp and neck: Secondary | ICD-10-CM | POA: Diagnosis not present

## 2024-04-25 DIAGNOSIS — J398 Other specified diseases of upper respiratory tract: Secondary | ICD-10-CM | POA: Diagnosis present

## 2024-04-25 DIAGNOSIS — E042 Nontoxic multinodular goiter: Secondary | ICD-10-CM | POA: Diagnosis present

## 2024-04-25 NOTE — H&P (Signed)
 "    REFERRING PHYSICIAN: Thapa, Sudan, MD  PROVIDER: Llesenia Fogal OZELL SPINNER, MD  Chief Complaint: New Consultation (Multiple thyroid  nodules)  History of Present Illness:  Patient is referred by Dr. 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 Patient has had a history of thyroid  goiter dating back approximately 20 years. It is gradually increased in size. He has undergone fine-needle aspiration biopsy on multiple occasions. Most recently he underwent a screening CT scan of the chest to rule out lung cancer in early September 2025. This was followed by a thyroid  ultrasound performed at the end of September which demonstrated a 7.1 cm nodule in the right thyroid  lobe, a 2.9 cm nodule in the left thyroid  lobe, and multiple other smaller nodules. Patient underwent fine-needle aspiration biopsy of both the dominant nodule on the right and the left. Cytopathology was benign. Recent TSH level is normal at 4.04. Patient has never been on thyroid  medication. Of note, the patient does have a history of lymphoma at age 14. He was treated with surgery and radiation. The thyroid  was within the radiation field. There is no family history of multinodular thyroid  goiter and no family history of thyroid  cancer. Presents today accompanied by his family to discuss thyroid  surgery.  Review of Systems: A complete review of systems was obtained from the patient. I have reviewed this information and discussed as appropriate with the patient. See HPI as well for other ROS.  Review of Systems  Constitutional: Negative.  HENT:  Globus sensation  Eyes: Negative.  Respiratory: Positive for shortness of breath.  Postural dyspnea  Cardiovascular: Negative.  Gastrointestinal:  Dysphagia, solid and liquid  Genitourinary: Negative.  Musculoskeletal: Negative.  Skin: Negative.  Neurological: Negative.  Endo/Heme/Allergies: Negative.   Psychiatric/Behavioral: Negative.    Medical History: Past Medical History:  Diagnosis Date  COPD (chronic obstructive pulmonary disease) (CMS/HHS-HCC)  GERD (gastroesophageal reflux disease)  Heart murmur  History of cancer  Hypertension   Patient Active Problem List  Diagnosis  Multiple thyroid  nodules  Enlarged thyroid  gland  Tracheal deviation   Past Surgical History:  Procedure Laterality Date  Fracture surgery    No Known Allergies  Current Outpatient Medications on File Prior to Visit  Medication Sig Dispense Refill  amLODIPine  (NORVASC ) 10 MG tablet Take 10 mg by mouth once daily  lisinopriL  (ZESTRIL ) 40 MG tablet Take 40 mg by mouth once daily  metoprolol  TARTrate (LOPRESSOR ) 25 MG tablet Take 25 mg by mouth 2 (two) times daily  rosuvastatin  (CRESTOR ) 20 MG tablet Take 20 mg by mouth once daily  tiotropium-olodateroL (STIOLTO RESPIMAT ) 2.5-2.5 mcg/actuation inhaler Inhale 2 inhalations into the lungs once daily   No current facility-administered medications on file prior to visit.   Family History  Problem Relation Age of Onset  High blood pressure (Hypertension) Father    Social History   Tobacco Use  Smoking Status Former  Types: Cigarettes  Passive exposure: Past  Smokeless Tobacco Never    Social History   Socioeconomic History  Marital status: Married  Tobacco Use  Smoking status: Former  Types: Cigarettes  Passive exposure: Past  Smokeless tobacco: Never   Social Drivers of Health   Housing Stability: Unknown (03/11/2024)  Housing Stability Vital Sign  Homeless in the Last Year: No   Objective:   Vitals:   BP: 119/81  Pulse: 68  Temp: 36.8 C (98.3 F)  TempSrc: Temporal  SpO2: 98%  Weight: (!) 100.9 kg (222 lb  6.4 oz)  Height: 175.3 cm (5' 9)  PainSc: 0-No pain   Body mass index is 32.84 kg/m.  Physical Exam   GENERAL APPEARANCE Comfortable, no acute issues Development: normal Gross deformities:  none  SKIN Rash, lesions, ulcers: none Induration, erythema: none Nodules: none palpable  EYES Conjunctiva and lids: normal Pupils: equal  EARS, NOSE, MOUTH, THROAT External ears: no lesion or deformity External nose: no lesion or deformity Hearing: grossly normal  NECK Symmetric: no Trachea: Significant deviation to the left Thyroid : There is a dominant smooth relatively firm mass occupying the entirety of the right thyroid  lobe. It is mobile and nontender. There is no associated lymphadenopathy. Left thyroid  lobe is somewhat difficult to palpate due to the deviation to the left side.  CHEST/CV Not assessed  ABDOMEN Not assessed  GENITOURINARY/RECTAL Not assessed  MUSCULOSKELETAL Station and gait: normal Digits and nails: no clubbing or cyanosis Muscle strength: grossly normal all extremities Deformity: none  LYMPHATIC Cervical: none palpable Supraclavicular: none palpable  PSYCHIATRIC Oriented to person, place, and time: yes Mood and affect: normal for situation Judgment and insight: appropriate for situation   Assessment and Plan:   Multiple thyroid  nodules Enlarged thyroid  gland Tracheal deviation  Patient is referred by his endocrinologist, Dr. 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  Patient provided with a copy of The Thyroid  Book: Medical and Surgical Treatment of Thyroid  Problems, published by [employer], 16 pages. Book reviewed and explained to patient during visit today.  They we reviewed his clinical history. We reviewed his recent imaging studies including the CT scan of the chest and the ultrasound examination of the neck. We reviewed his laboratory studies. We reviewed the cytopathology from his fine-needle aspiration biopsies.  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 Patient will require total thyroidectomy given the multinodular gland with tracheal deviation and especially in light of the fact that he has had previous radiation therapy with the thyroid  within the radiation field. Today we discussed total thyroidectomy. We discussed the risks and benefits of the procedure including the risk of recurrent laryngeal nerve injury and injury to parathyroid glands. We discussed the size and location of the surgical incision. We discussed the hospital stay to be anticipated. We discussed the need for lifelong thyroid  hormone replacement therapy. The patient understands and wishes to proceed.  Previous imaging studies failed to capture the entire thyroid  gland and the entire airway. I would like to obtain a CT scan of the neck with contrast to better evaluate the thyroid  as well as the degree of tracheal deviation and possible tracheal narrowing. This will be helpful for our anesthesiology team. We will order this study today.  Patient will undergo the above studies. I will contact him with the results and then we will make plans for thyroidectomy at some point in the near future.   Krystal Spinner, MD [facility] practice Office: [phone]   "

## 2024-04-28 ENCOUNTER — Encounter (HOSPITAL_COMMUNITY): Admission: RE | Disposition: A | Payer: Self-pay | Source: Home / Self Care | Attending: Surgery

## 2024-04-28 ENCOUNTER — Ambulatory Visit (HOSPITAL_COMMUNITY)
Admission: RE | Admit: 2024-04-28 | Discharge: 2024-04-29 | Disposition: A | Discharge status: Home / Self Care | Attending: Surgery | Admitting: Surgery

## 2024-04-28 ENCOUNTER — Encounter (HOSPITAL_COMMUNITY): Payer: Self-pay | Admitting: Surgery

## 2024-04-28 ENCOUNTER — Ambulatory Visit (HOSPITAL_COMMUNITY): Payer: Self-pay | Admitting: Anesthesiology

## 2024-04-28 ENCOUNTER — Encounter (HOSPITAL_COMMUNITY): Payer: Self-pay | Admitting: Physician Assistant

## 2024-04-28 ENCOUNTER — Other Ambulatory Visit: Payer: Self-pay

## 2024-04-28 DIAGNOSIS — E041 Nontoxic single thyroid nodule: Secondary | ICD-10-CM | POA: Diagnosis not present

## 2024-04-28 DIAGNOSIS — E042 Nontoxic multinodular goiter: Secondary | ICD-10-CM | POA: Diagnosis not present

## 2024-04-28 DIAGNOSIS — J449 Chronic obstructive pulmonary disease, unspecified: Secondary | ICD-10-CM | POA: Insufficient documentation

## 2024-04-28 DIAGNOSIS — Z87891 Personal history of nicotine dependence: Secondary | ICD-10-CM | POA: Diagnosis not present

## 2024-04-28 DIAGNOSIS — D34 Benign neoplasm of thyroid gland: Secondary | ICD-10-CM | POA: Diagnosis not present

## 2024-04-28 DIAGNOSIS — R519 Headache, unspecified: Secondary | ICD-10-CM | POA: Diagnosis not present

## 2024-04-28 DIAGNOSIS — Z923 Personal history of irradiation: Secondary | ICD-10-CM | POA: Diagnosis not present

## 2024-04-28 DIAGNOSIS — E049 Nontoxic goiter, unspecified: Secondary | ICD-10-CM | POA: Diagnosis present

## 2024-04-28 DIAGNOSIS — K219 Gastro-esophageal reflux disease without esophagitis: Secondary | ICD-10-CM | POA: Diagnosis not present

## 2024-04-28 DIAGNOSIS — I2510 Atherosclerotic heart disease of native coronary artery without angina pectoris: Secondary | ICD-10-CM | POA: Diagnosis not present

## 2024-04-28 DIAGNOSIS — I472 Ventricular tachycardia, unspecified: Secondary | ICD-10-CM | POA: Diagnosis not present

## 2024-04-28 DIAGNOSIS — Z8572 Personal history of non-Hodgkin lymphomas: Secondary | ICD-10-CM | POA: Diagnosis not present

## 2024-04-28 DIAGNOSIS — R0602 Shortness of breath: Secondary | ICD-10-CM | POA: Insufficient documentation

## 2024-04-28 DIAGNOSIS — Z79899 Other long term (current) drug therapy: Secondary | ICD-10-CM | POA: Insufficient documentation

## 2024-04-28 DIAGNOSIS — J398 Other specified diseases of upper respiratory tract: Secondary | ICD-10-CM | POA: Insufficient documentation

## 2024-04-28 DIAGNOSIS — I10 Essential (primary) hypertension: Secondary | ICD-10-CM | POA: Insufficient documentation

## 2024-04-28 DIAGNOSIS — R1310 Dysphagia, unspecified: Secondary | ICD-10-CM | POA: Diagnosis not present

## 2024-04-28 DIAGNOSIS — E0789 Other specified disorders of thyroid: Secondary | ICD-10-CM | POA: Diagnosis not present

## 2024-04-28 HISTORY — PX: THYROIDECTOMY: SHX17

## 2024-04-28 SURGERY — THYROIDECTOMY
Anesthesia: General | Site: Neck

## 2024-04-28 MED ORDER — KETAMINE HCL 50 MG/5ML IJ SOSY
PREFILLED_SYRINGE | INTRAMUSCULAR | Status: DC | PRN
  Administered 2024-04-28: 20 mg via INTRAVENOUS

## 2024-04-28 MED ORDER — OXYCODONE HCL 5 MG PO TABS: 5.0000 mg | ORAL_TABLET | Freq: Once | ORAL | Status: AC | PRN

## 2024-04-28 MED ORDER — PHENYLEPHRINE HCL-NACL 20-0.9 MG/250ML-% IV SOLN
INTRAVENOUS | Status: AC
  Filled 2024-04-28: qty 250

## 2024-04-28 MED ORDER — HYDROMORPHONE HCL 1 MG/ML IJ SOLN: 1.0000 mg | INTRAMUSCULAR | Status: DC | PRN

## 2024-04-28 MED ORDER — CHLORHEXIDINE GLUCONATE CLOTH 2 % EX PADS: 6.0000 | MEDICATED_PAD | Freq: Once | CUTANEOUS | Status: DC

## 2024-04-28 MED ORDER — ARFORMOTEROL TARTRATE 15 MCG/2ML IN NEBU: 15.0000 ug | INHALATION_SOLUTION | Freq: Two times a day (BID) | RESPIRATORY_TRACT | Status: DC

## 2024-04-28 MED ORDER — ROCURONIUM BROMIDE 100 MG/10ML IV SOLN
INTRAVENOUS | Status: DC | PRN
  Administered 2024-04-28 (×2): 10 mg via INTRAVENOUS
  Administered 2024-04-28: 50 mg via INTRAVENOUS

## 2024-04-28 MED ORDER — FENTANYL CITRATE (PF) 50 MCG/ML IJ SOSY
PREFILLED_SYRINGE | INTRAMUSCULAR | Status: AC
  Filled 2024-04-28: qty 1

## 2024-04-28 MED ORDER — ONDANSETRON HCL 4 MG/2ML IJ SOLN: 4.0000 mg | Freq: Four times a day (QID) | INTRAMUSCULAR | Status: DC | PRN

## 2024-04-28 MED ORDER — MIDAZOLAM HCL 5 MG/5ML IJ SOLN
INTRAMUSCULAR | Status: DC | PRN
  Administered 2024-04-28: 2 mg via INTRAVENOUS

## 2024-04-28 MED ORDER — CALCIUM CARBONATE 1250 (500 CA) MG PO TABS
2.0000 | ORAL_TABLET | Freq: Three times a day (TID) | ORAL | Status: DC
  Administered 2024-04-28 – 2024-04-29 (×2): 2500 mg via ORAL
  Filled 2024-04-28 (×2): qty 2

## 2024-04-28 MED ORDER — ONDANSETRON 4 MG PO TBDP: 4.0000 mg | ORAL_TABLET | Freq: Four times a day (QID) | ORAL | Status: DC | PRN

## 2024-04-28 MED ORDER — MIDAZOLAM HCL 2 MG/2ML IJ SOLN
INTRAMUSCULAR | Status: AC
  Filled 2024-04-28: qty 2

## 2024-04-28 MED ORDER — ALBUTEROL SULFATE (2.5 MG/3ML) 0.083% IN NEBU: 3.0000 mL | INHALATION_SOLUTION | Freq: Four times a day (QID) | RESPIRATORY_TRACT | Status: DC | PRN

## 2024-04-28 MED ORDER — SODIUM CHLORIDE 0.45 % IV SOLN: INTRAVENOUS | Status: DC

## 2024-04-28 MED ORDER — PROPOFOL 10 MG/ML IV BOLUS
INTRAVENOUS | Status: DC | PRN
  Administered 2024-04-28: 20 mg via INTRAVENOUS
  Administered 2024-04-28 (×3): 30 mg via INTRAVENOUS
  Administered 2024-04-28: 20 mg via INTRAVENOUS

## 2024-04-28 MED ORDER — FENTANYL CITRATE (PF) 50 MCG/ML IJ SOSY
25.0000 ug | PREFILLED_SYRINGE | INTRAMUSCULAR | Status: DC | PRN
  Administered 2024-04-28 (×3): 50 ug via INTRAVENOUS

## 2024-04-28 MED ORDER — ONDANSETRON HCL 4 MG/2ML IJ SOLN
INTRAMUSCULAR | Status: AC
  Filled 2024-04-28: qty 2

## 2024-04-28 MED ORDER — 0.9 % SODIUM CHLORIDE (POUR BTL) OPTIME
TOPICAL | Status: DC | PRN
  Administered 2024-04-28: 1000 mL

## 2024-04-28 MED ORDER — ORAL CARE MOUTH RINSE: 15.0000 mL | Freq: Once | OROMUCOSAL | Status: AC

## 2024-04-28 MED ORDER — OXYCODONE HCL 5 MG/5ML PO SOLN
ORAL | Status: AC
  Filled 2024-04-28: qty 5

## 2024-04-28 MED ORDER — CHLORHEXIDINE GLUCONATE 0.12 % MT SOLN
15.0000 mL | Freq: Once | OROMUCOSAL | Status: AC
  Administered 2024-04-28: 15 mL via OROMUCOSAL

## 2024-04-28 MED ORDER — FENTANYL CITRATE (PF) 100 MCG/2ML IJ SOLN
INTRAMUSCULAR | Status: DC | PRN
  Administered 2024-04-28 (×5): 50 ug via INTRAVENOUS

## 2024-04-28 MED ORDER — ACETAMINOPHEN 10 MG/ML IV SOLN
1000.0000 mg | Freq: Once | INTRAVENOUS | Status: DC | PRN
  Administered 2024-04-28: 1000 mg via INTRAVENOUS

## 2024-04-28 MED ORDER — LIDOCAINE HCL (CARDIAC) PF 100 MG/5ML IV SOSY
PREFILLED_SYRINGE | INTRAVENOUS | Status: DC | PRN
  Administered 2024-04-28: 100 mg via INTRAVENOUS

## 2024-04-28 MED ORDER — PHENYLEPHRINE HCL (PRESSORS) 10 MG/ML IV SOLN
INTRAVENOUS | Status: DC | PRN
  Administered 2024-04-28: 40 ug via INTRAVENOUS

## 2024-04-28 MED ORDER — LACTATED RINGERS IV SOLN: INTRAVENOUS | Status: DC

## 2024-04-28 MED ORDER — SUGAMMADEX SODIUM 200 MG/2ML IV SOLN
INTRAVENOUS | Status: AC
  Filled 2024-04-28: qty 2

## 2024-04-28 MED ORDER — PHENYLEPHRINE HCL-NACL 20-0.9 MG/250ML-% IV SOLN
INTRAVENOUS | Status: DC | PRN
  Administered 2024-04-28: 25 ug/min via INTRAVENOUS

## 2024-04-28 MED ORDER — ACETAMINOPHEN 325 MG PO TABS
650.0000 mg | ORAL_TABLET | Freq: Four times a day (QID) | ORAL | Status: DC | PRN
  Administered 2024-04-29: 650 mg via ORAL
  Filled 2024-04-28: qty 2

## 2024-04-28 MED ORDER — BUDESON-GLYCOPYRROL-FORMOTEROL 160-9-4.8 MCG/ACT IN AERO
2.0000 | INHALATION_SPRAY | Freq: Two times a day (BID) | RESPIRATORY_TRACT | Status: DC
  Administered 2024-04-28 – 2024-04-29 (×2): 2 via RESPIRATORY_TRACT
  Filled 2024-04-28: qty 5.9

## 2024-04-28 MED ORDER — OXYCODONE HCL 5 MG/5ML PO SOLN
5.0000 mg | Freq: Once | ORAL | Status: AC | PRN
  Administered 2024-04-28: 5 mg via ORAL

## 2024-04-28 MED ORDER — ONDANSETRON HCL 4 MG/2ML IJ SOLN
INTRAMUSCULAR | Status: DC | PRN
  Administered 2024-04-28: 4 mg via INTRAVENOUS

## 2024-04-28 MED ORDER — UMECLIDINIUM BROMIDE 62.5 MCG/ACT IN AEPB: 1.0000 | INHALATION_SPRAY | Freq: Every day | RESPIRATORY_TRACT | Status: DC

## 2024-04-28 MED ORDER — SUGAMMADEX SODIUM 200 MG/2ML IV SOLN
INTRAVENOUS | Status: DC | PRN
  Administered 2024-04-28: 200 mg via INTRAVENOUS

## 2024-04-28 MED ORDER — AMLODIPINE BESYLATE 10 MG PO TABS
10.0000 mg | ORAL_TABLET | Freq: Every day | ORAL | Status: DC
  Administered 2024-04-29: 10 mg via ORAL
  Filled 2024-04-28: qty 1

## 2024-04-28 MED ORDER — CEFAZOLIN SODIUM-DEXTROSE 2-4 GM/100ML-% IV SOLN
2.0000 g | INTRAVENOUS | Status: AC
  Administered 2024-04-28: 2 g via INTRAVENOUS
  Filled 2024-04-28: qty 100

## 2024-04-28 MED ORDER — ACETAMINOPHEN 650 MG RE SUPP: 650.0000 mg | Freq: Four times a day (QID) | RECTAL | Status: DC | PRN

## 2024-04-28 MED ORDER — METOPROLOL TARTRATE 50 MG PO TABS
50.0000 mg | ORAL_TABLET | Freq: Every day | ORAL | Status: DC
  Administered 2024-04-29: 50 mg via ORAL
  Filled 2024-04-28: qty 1

## 2024-04-28 MED ORDER — ALBUTEROL SULFATE HFA 108 (90 BASE) MCG/ACT IN AERS
INHALATION_SPRAY | RESPIRATORY_TRACT | Status: DC | PRN
  Administered 2024-04-28: 2 via RESPIRATORY_TRACT

## 2024-04-28 MED ORDER — LISINOPRIL 20 MG PO TABS
40.0000 mg | ORAL_TABLET | Freq: Every day | ORAL | Status: DC
  Administered 2024-04-29: 40 mg via ORAL
  Filled 2024-04-28: qty 2

## 2024-04-28 MED ORDER — PANTOPRAZOLE SODIUM 40 MG PO TBEC
40.0000 mg | DELAYED_RELEASE_TABLET | Freq: Every day | ORAL | Status: DC
  Administered 2024-04-29: 40 mg via ORAL
  Filled 2024-04-28: qty 1

## 2024-04-28 MED ORDER — AMISULPRIDE (ANTIEMETIC) 5 MG/2ML IV SOLN
INTRAVENOUS | Status: AC
  Filled 2024-04-28: qty 4

## 2024-04-28 MED ORDER — DEXAMETHASONE SODIUM PHOSPHATE 4 MG/ML IJ SOLN
INTRAMUSCULAR | Status: DC | PRN
  Administered 2024-04-28: 8 mg via INTRAVENOUS

## 2024-04-28 MED ORDER — FENTANYL CITRATE (PF) 250 MCG/5ML IJ SOLN
INTRAMUSCULAR | Status: AC
  Filled 2024-04-28: qty 5

## 2024-04-28 MED ORDER — ROCURONIUM BROMIDE 10 MG/ML (PF) SYRINGE
PREFILLED_SYRINGE | INTRAVENOUS | Status: AC
  Filled 2024-04-28: qty 10

## 2024-04-28 MED ORDER — KETAMINE HCL 50 MG/5ML IJ SOSY
PREFILLED_SYRINGE | INTRAMUSCULAR | Status: AC
  Filled 2024-04-28: qty 5

## 2024-04-28 MED ORDER — ALBUTEROL SULFATE HFA 108 (90 BASE) MCG/ACT IN AERS
INHALATION_SPRAY | RESPIRATORY_TRACT | Status: AC
  Filled 2024-04-28: qty 6.7

## 2024-04-28 MED ORDER — ACETAMINOPHEN 10 MG/ML IV SOLN
INTRAVENOUS | Status: AC
  Filled 2024-04-28: qty 100

## 2024-04-28 MED ORDER — AMISULPRIDE (ANTIEMETIC) 5 MG/2ML IV SOLN
10.0000 mg | Freq: Once | INTRAVENOUS | Status: AC | PRN
  Administered 2024-04-28: 10 mg via INTRAVENOUS

## 2024-04-28 MED ORDER — HEMOSTATIC AGENTS (NO CHARGE) OPTIME
TOPICAL | Status: DC | PRN
  Administered 2024-04-28: 1 via TOPICAL

## 2024-04-28 MED ORDER — PROPOFOL 10 MG/ML IV BOLUS
INTRAVENOUS | Status: AC
  Filled 2024-04-28: qty 20

## 2024-04-28 MED ORDER — ONDANSETRON HCL 4 MG/2ML IJ SOLN: 4.0000 mg | Freq: Once | INTRAMUSCULAR | Status: DC | PRN

## 2024-04-28 MED ORDER — OXYCODONE HCL 5 MG PO TABS: 5.0000 mg | ORAL_TABLET | ORAL | Status: DC | PRN

## 2024-04-28 MED ORDER — TRAMADOL HCL 50 MG PO TABS: 50.0000 mg | ORAL_TABLET | Freq: Four times a day (QID) | ORAL | Status: DC | PRN

## 2024-04-28 SURGICAL SUPPLY — 30 items
BAG COUNTER SPONGE SURGICOUNT (BAG) ×1 IMPLANT
BLADE SURG 15 STRL LF DISP TIS (BLADE) ×1 IMPLANT
CHLORAPREP W/TINT 26 (MISCELLANEOUS) ×1 IMPLANT
CLIP TI MEDIUM 6 (CLIP) ×2 IMPLANT
CLIP TI WIDE RED SMALL 6 (CLIP) ×2 IMPLANT
COVER SURGICAL LIGHT HANDLE (MISCELLANEOUS) ×1 IMPLANT
DERMABOND ADVANCED .7 DNX12 (GAUZE/BANDAGES/DRESSINGS) ×1 IMPLANT
DRAPE LAPAROTOMY T 98X78 PEDS (DRAPES) ×1 IMPLANT
DRAPE UTILITY XL STRL (DRAPES) ×1 IMPLANT
ELECT PENCIL ROCKER SW 15FT (MISCELLANEOUS) ×1 IMPLANT
ELECT REM PT RETURN 15FT ADLT (MISCELLANEOUS) ×1 IMPLANT
GAUZE 4X4 16PLY ~~LOC~~+RFID DBL (SPONGE) ×1 IMPLANT
GLOVE SURG ORTHO 8.0 STRL STRW (GLOVE) ×1 IMPLANT
GLOVE SURG SYN 6.5 PF PI (GLOVE) IMPLANT
GOWN STRL REUS W/ TWL XL LVL3 (GOWN DISPOSABLE) ×2 IMPLANT
HEMOSTAT SURGICEL 2X4 FIBR (HEMOSTASIS) ×1 IMPLANT
ILLUMINATOR WAVEGUIDE N/F (MISCELLANEOUS) ×1 IMPLANT
KIT BASIN OR (CUSTOM PROCEDURE TRAY) ×1 IMPLANT
KIT TURNOVER KIT A (KITS) ×1 IMPLANT
PACK BASIC VI WITH GOWN DISP (CUSTOM PROCEDURE TRAY) ×1 IMPLANT
PAD MAGNETIC INSTR ST 16X20 (MISCELLANEOUS) ×1 IMPLANT
PROTECTOR NERVE ULNAR (MISCELLANEOUS) IMPLANT
SHEARS HARMONIC 9CM CVD (BLADE) ×1 IMPLANT
SOL PREP POV-IOD 4OZ 10% (MISCELLANEOUS) IMPLANT
SUT MNCRL AB 4-0 PS2 18 (SUTURE) ×1 IMPLANT
SUT SILK 3 0 SH 30 (SUTURE) ×1 IMPLANT
SUT VIC AB 3-0 SH 18 (SUTURE) ×2 IMPLANT
SYR BULB IRRIG 60ML STRL (SYRINGE) ×1 IMPLANT
TOWEL OR DSP ST BLU DLX 10/PK (DISPOSABLE) ×1 IMPLANT
TUBING CONNECTING 10 (TUBING) ×1 IMPLANT

## 2024-04-28 NOTE — Anesthesia Postprocedure Evaluation (Signed)
"   Anesthesia Post Note  Patient: Kyle Mcdonald.  Procedure(s) Performed: THYROIDECTOMY (Neck)     Patient location during evaluation: PACU Anesthesia Type: General Level of consciousness: awake Pain management: pain level controlled Vital Signs Assessment: post-procedure vital signs reviewed and stable Respiratory status: spontaneous breathing Cardiovascular status: blood pressure returned to baseline Postop Assessment: no apparent nausea or vomiting Anesthetic complications: no   No notable events documented.  Last Vitals:  Vitals:   04/28/24 1415 04/28/24 1438  BP: 127/79 (!) 144/87  Pulse: 72 77  Resp: 14 15  Temp:  36.4 C  SpO2: 98% 96%    Last Pain:  Vitals:   04/28/24 1438  TempSrc:   PainSc: 3                  Catlin Doria T Colhoun      "

## 2024-04-28 NOTE — Op Note (Signed)
 Procedure Note  Pre-operative Diagnosis:  Enlarged thyroid  gland, multiple thyroid  nodules, tracheal deviation, history of radiation exposure  Post-operative Diagnosis:  same  Surgeon:  Krystal Spinner, MD  Assistant:  Tonja Shaper, PA-C   Procedure:  Total thyroidectomy  Anesthesia:  General  Estimated Blood Loss:  25 cc  Drains: none         Specimen: thyroid  to pathology  Indications:  Patient is referred by Dr. 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 Patient has had a history of thyroid  goiter dating back approximately 20 years. It is gradually increased in size. He has undergone fine-needle aspiration biopsy on multiple occasions. Most recently he underwent a screening CT scan of the chest to rule out lung cancer in early September 2025. This was followed by a thyroid  ultrasound performed at the end of September which demonstrated a 7.1 cm nodule in the right thyroid  lobe, a 2.9 cm nodule in the left thyroid  lobe, and multiple other smaller nodules. Patient underwent fine-needle aspiration biopsy of both the dominant nodule on the right and the left. Cytopathology was benign. Recent TSH level is normal at 4.04. Patient has never been on thyroid  medication. Of note, the patient does have a history of lymphoma at age 59. He was treated with surgery and radiation. The thyroid  was within the radiation field. There is no family history of multinodular thyroid  goiter and no family history of thyroid  cancer. Presents today accompanied by his family to discuss thyroid  surgery.  Procedure Details: Procedure was done in OR #1 at the [facility]. The patient was brought to the operating room and placed in a supine position on the operating room table. Following administration of general anesthesia, the patient was positioned and then prepped and draped in the usual aseptic fashion. After ascertaining that an adequate  level of anesthesia had been achieved, a small Kocher incision was made with #15 blade. Dissection was carried through subcutaneous tissues and platysma.Hemostasis was achieved with the electrocautery. Skin flaps were elevated cephalad and caudad from the thyroid  notch to the sternal notch. A Mahorner self-retaining retractor was placed for exposure. Strap muscles were incised in the midline and dissection was begun on the left side.  Strap muscles were reflected laterally.  Left thyroid  lobe was relatively small and nodular.  The left lobe was gently mobilized with blunt dissection. Superior pole vessels were dissected out and divided individually between small and medium ligaclips with the harmonic scalpel. The thyroid  lobe was rolled anteriorly. Branches of the inferior thyroid  artery were divided between small ligaclips with the harmonic scalpel. Inferior venous tributaries were divided between ligaclips. Both the superior and inferior parathyroid glands were identified and preserved on their vascular pedicles. The recurrent laryngeal nerve was identified and preserved along its course. The ligament of Court was released with the electrocautery and the gland was mobilized onto the anterior trachea. Isthmus was mobilized across the midline. There was a moderate sized pyramidal lobe present which was dissected off of the thyroid  cartilage.  There was a potential lymph node adjacent to the parenchyma which was resected with the pyramidal lobe.  The pyramidal lobe and nodule were submitted separate from the thyroid  to pathology. Dry pack was placed in the left neck.  The right thyroid  lobe was gently mobilized with blunt dissection. Right thyroid  lobe was markedly enlarged with a rounded mass occupying most of the lobe. Superior pole vessels were dissected out and divided between small  and medium ligaclips with the Harmonic scalpel. Superior parathyroid was identified and preserved. Inferior venous tributaries  were divided between medium ligaclips with the harmonic scalpel. The right thyroid  lobe was rolled anteriorly and the branches of the inferior thyroid  artery divided between small ligaclips. The right recurrent laryngeal nerve was identified and preserved along its course. The ligament of Court was released with the electrocautery. The right thyroid  lobe was mobilized onto the anterior trachea and the remainder of the thyroid  was dissected off the anterior trachea and the thyroid  was completely excised. A suture was used to mark the left lobe. The entire thyroid  gland was submitted to pathology for review.  Palpation of the operative field demonstrated no evidence of residual disease and no abnormal lymph nodes.  The neck was irrigated with warm saline. Fibrillar was placed throughout the operative field. Strap muscles were approximated in the midline with interrupted 3-0 Vicryl sutures. Platysma was closed with interrupted 3-0 Vicryl sutures. Skin was closed with a running 4-0 Monocryl subcuticular suture. Wound was washed and Dermabond was applied. The patient was awakened from anesthesia and brought to the recovery room. The patient tolerated the procedure well.   Krystal Spinner, MD [facility] Surgery Office: [phone]

## 2024-04-28 NOTE — Interval H&P Note (Signed)
 History and Physical Interval Note:  04/28/2024 8:59 AM  Kyle Mcdonald.  has presented today for surgery, with the diagnosis of RIGHT THYROID  NODULE, TRACHEAL DEVIATION.  The various methods of treatment have been discussed with the patient and family. After consideration of risks, benefits and other options for treatment, the patient has consented to    Procedures with comments: THYROIDECTOMY (N/A) - TOTAL THYROIDECTOMY as a surgical intervention.    The patient's history has been reviewed, patient examined, no change in status, stable for surgery.  I have reviewed the patient's chart and labs.  Questions were answered to the patient's satisfaction.    Krystal Spinner, MD [facility] practice Office: [phone]   Krystal Spinner

## 2024-04-28 NOTE — Anesthesia Procedure Notes (Signed)
 Procedure Name: Intubation Date/Time: 04/28/2024 10:01 AM  Performed by: Dartha Meckel, CRNAPre-anesthesia Checklist: Patient identified, Emergency Drugs available, Suction available and Patient being monitored Patient Re-evaluated:Patient Re-evaluated prior to induction Oxygen Delivery Method: Circle system utilized Preoxygenation: Pre-oxygenation with 100% oxygen Induction Type: IV induction Ventilation: Mask ventilation without difficulty Laryngoscope Size: Glidescope and 4 Grade View: Grade I Tube type: Oral Tube size: 7.0 mm Number of attempts: 1 Airway Equipment and Method: Stylet and Oral airway Placement Confirmation: ETT inserted through vocal cords under direct vision, positive ETCO2 and breath sounds checked- equal and bilateral Secured at: 22 cm Tube secured with: Tape Dental Injury: Teeth and Oropharynx as per pre-operative assessment

## 2024-04-28 NOTE — Transfer of Care (Signed)
 Immediate Anesthesia Transfer of Care Note  Patient: Kyle Mcdonald.  Procedure(s) Performed: THYROIDECTOMY (Neck)  Patient Location: PACU  Anesthesia Type:General  Level of Consciousness: awake and alert   Airway & Oxygen Therapy: Patient Spontanous Breathing and Patient connected to nasal cannula oxygen  Post-op Assessment: Report given to RN and Post -op Vital signs reviewed and stable  Post vital signs: Reviewed and stable  Last Vitals:  Vitals Value Taken Time  BP    Temp    Pulse    Resp    SpO2      Last Pain:  Vitals:   04/28/24 0838  TempSrc: Oral         Complications: No notable events documented.

## 2024-04-29 ENCOUNTER — Other Ambulatory Visit (HOSPITAL_COMMUNITY): Payer: Self-pay

## 2024-04-29 ENCOUNTER — Encounter (HOSPITAL_COMMUNITY): Payer: Self-pay | Admitting: Surgery

## 2024-04-29 ENCOUNTER — Ambulatory Visit: Payer: Self-pay | Admitting: Surgery

## 2024-04-29 DIAGNOSIS — Z87891 Personal history of nicotine dependence: Secondary | ICD-10-CM | POA: Diagnosis not present

## 2024-04-29 DIAGNOSIS — K219 Gastro-esophageal reflux disease without esophagitis: Secondary | ICD-10-CM | POA: Diagnosis not present

## 2024-04-29 DIAGNOSIS — R1310 Dysphagia, unspecified: Secondary | ICD-10-CM | POA: Diagnosis not present

## 2024-04-29 DIAGNOSIS — R0602 Shortness of breath: Secondary | ICD-10-CM | POA: Diagnosis not present

## 2024-04-29 DIAGNOSIS — I10 Essential (primary) hypertension: Secondary | ICD-10-CM | POA: Diagnosis not present

## 2024-04-29 DIAGNOSIS — I2510 Atherosclerotic heart disease of native coronary artery without angina pectoris: Secondary | ICD-10-CM | POA: Diagnosis not present

## 2024-04-29 DIAGNOSIS — Z8572 Personal history of non-Hodgkin lymphomas: Secondary | ICD-10-CM | POA: Diagnosis not present

## 2024-04-29 DIAGNOSIS — E042 Nontoxic multinodular goiter: Secondary | ICD-10-CM | POA: Diagnosis not present

## 2024-04-29 DIAGNOSIS — Z923 Personal history of irradiation: Secondary | ICD-10-CM | POA: Diagnosis not present

## 2024-04-29 DIAGNOSIS — J449 Chronic obstructive pulmonary disease, unspecified: Secondary | ICD-10-CM | POA: Diagnosis not present

## 2024-04-29 DIAGNOSIS — I472 Ventricular tachycardia, unspecified: Secondary | ICD-10-CM | POA: Diagnosis not present

## 2024-04-29 DIAGNOSIS — Z79899 Other long term (current) drug therapy: Secondary | ICD-10-CM | POA: Diagnosis not present

## 2024-04-29 DIAGNOSIS — R519 Headache, unspecified: Secondary | ICD-10-CM | POA: Diagnosis not present

## 2024-04-29 DIAGNOSIS — E049 Nontoxic goiter, unspecified: Secondary | ICD-10-CM | POA: Diagnosis not present

## 2024-04-29 DIAGNOSIS — J398 Other specified diseases of upper respiratory tract: Secondary | ICD-10-CM | POA: Diagnosis not present

## 2024-04-29 LAB — SURGICAL PATHOLOGY

## 2024-04-29 LAB — CALCIUM: Calcium: 8.7 mg/dL — ABNORMAL LOW (ref 8.9–10.3)

## 2024-04-29 MED ORDER — CALCIUM CARBONATE ANTACID 500 MG PO CHEW
2.0000 | CHEWABLE_TABLET | Freq: Two times a day (BID) | ORAL | 1 refills | Status: AC
  Filled 2024-04-29: qty 90, 23d supply, fill #0
  Filled 2024-04-29: qty 150, 38d supply, fill #0

## 2024-04-29 MED ORDER — LEVOTHYROXINE SODIUM 100 MCG PO TABS
100.0000 ug | ORAL_TABLET | Freq: Every day | ORAL | 3 refills | Status: DC
  Filled 2024-04-29: qty 30, 30d supply, fill #0

## 2024-04-29 NOTE — Discharge Instructions (Signed)

## 2024-04-29 NOTE — Discharge Summary (Signed)
 "   Physician Discharge Summary   Patient ID: Kyle Mcdonald. MRN: [id number] DOB/AGE: 1965/07/25 58 y.o.  Admit date: 04/28/2024  Discharge date: 04/29/2024  Discharge Diagnoses:  Principal Problem:   Multiple thyroid  nodules Active Problems:   Enlarged thyroid  gland   Tracheal deviation   Enlarged thyroid    Discharged Condition: good  Hospital Course: Patient was admitted for observation following thyroid  surgery.  Post op course was uncomplicated.  Pain was well controlled.  Tolerated diet.  Post op calcium  level on morning following surgery was 8.7 mg/dl.  Patient was prepared for discharge home on POD#1.  Consults: None  Treatments: surgery: total thyroidectomy  Discharge Exam: Blood pressure (!) 153/81, pulse 94, temperature 97.8 F (36.6 C), temperature source Oral, resp. rate 16, height 5' 9 (1.753 m), weight 101.2 kg, SpO2 98%. HEENT - clear Neck - wound dry and intact; mild STS; voice normal; mild ecchymosis  Disposition: Home  Discharge Instructions     Increase activity slowly   Complete by: As directed    No dressing needed   Complete by: As directed       Allergies as of 04/29/2024       Reactions   Chlorhexidine  Gluconate Hives, Rash        Medication List     TAKE these medications    albuterol  108 (90 Base) MCG/ACT inhaler Commonly known as: VENTOLIN  HFA Inhale 2 puffs into the lungs every 6 (six) hours as needed for wheezing or shortness of breath.   amLODipine  10 MG tablet Commonly known as: NORVASC  Take 1 tablet (10 mg total) by mouth daily.   calcium  carbonate 500 MG chewable tablet Commonly known as: Tums Chew 2 tablets (400 mg of elemental calcium  total) by mouth 2 (two) times daily.   clobetasol cream 0.05 % Commonly known as: TEMOVATE Apply 1 Application topically daily as needed (spots on skin).   levothyroxine  100 MCG tablet Commonly known as: Synthroid  Take 1 tablet (100 mcg total) by mouth daily before  breakfast.   lisinopril  40 MG tablet Commonly known as: ZESTRIL  Take 1 tablet (40 mg total) by mouth daily.   metoprolol  tartrate 25 MG tablet Commonly known as: LOPRESSOR  Take 1 tablet (25 mg total) by mouth 2 (two) times daily. What changed:  how much to take when to take this   pantoprazole  40 MG tablet Commonly known as: PROTONIX  Take 1 tablet (40 mg total) by mouth daily.   rosuvastatin  20 MG tablet Commonly known as: CRESTOR  Take 1 tablet (20 mg total) by mouth daily.   Stiolto Respimat  2.5-2.5 MCG/ACT Aers Generic drug: Tiotropium Bromide-Olodaterol Inhale 2 puffs into the lungs daily.   Trelegy Ellipta  100-62.5-25 MCG/ACT Aepb Generic drug: Fluticasone-Umeclidin-Vilant Inhale 2 puffs into the lungs daily.               Discharge Care Instructions  (From admission, onward)           Start     Ordered   04/29/24 0000  No dressing needed        04/29/24 9142            Follow-up Information     Eletha Boas, MD. Schedule an appointment as soon as possible for a visit in 3 week(s).   Specialty: General Surgery Why: For wound re-check Contact information: [address] [place] [address] [phone]                 Boas Eletha,  MD [facility] Office: [phone]   Signed: Krystal Spinner 04/29/2024, 8:57 AM   "

## 2024-04-29 NOTE — Plan of Care (Signed)
" °  Problem: Activity: Goal: Risk for activity intolerance will decrease Outcome: Adequate for Discharge   Problem: Nutrition: Goal: Adequate nutrition will be maintained Outcome: Adequate for Discharge   Problem: Elimination: Goal: Will not experience complications related to urinary retention Outcome: Adequate for Discharge   Problem: Pain Managment: Goal: General experience of comfort will improve and/or be controlled Outcome: Adequate for Discharge   "

## 2024-04-29 NOTE — Progress Notes (Signed)
" °   04/29/24 0848  TOC Brief Assessment  Insurance and Status Reviewed  Patient has primary care physician Yes  Home environment has been reviewed home with spouse  Prior level of function: independent  Prior/Current Home Services No current home services  Social Drivers of Health Review SDOH reviewed no interventions necessary  Readmission risk has been reviewed Yes  Transition of care needs no transition of care needs at this time    "

## 2024-04-29 NOTE — Progress Notes (Signed)
 Pt meds delivered to bedside from out pt pharmacy

## 2024-04-29 NOTE — Progress Notes (Signed)
 Final pathology results show some atypia in the left lobe nodules, but overall is benign.  Good news.  Krystal Spinner, MD [facility] practice Office: [phone]

## 2024-04-29 NOTE — Progress Notes (Signed)
 Patient was given discharge instructions by [facility] RN, and all questions were answered. Patient was stable for discharge and was taken to the main exit by wheelchair.

## 2024-05-05 ENCOUNTER — Encounter: Payer: Self-pay | Admitting: Endocrinology

## 2024-05-05 ENCOUNTER — Ambulatory Visit: Admitting: Endocrinology

## 2024-05-05 VITALS — BP 136/84 | HR 78 | Resp 16 | Ht 69.0 in | Wt 225.2 lb

## 2024-05-05 DIAGNOSIS — Z9889 Other specified postprocedural states: Secondary | ICD-10-CM

## 2024-05-05 DIAGNOSIS — Z8639 Personal history of other endocrine, nutritional and metabolic disease: Secondary | ICD-10-CM

## 2024-05-05 DIAGNOSIS — E890 Postprocedural hypothyroidism: Secondary | ICD-10-CM

## 2024-05-05 DIAGNOSIS — Z9089 Acquired absence of other organs: Secondary | ICD-10-CM | POA: Diagnosis not present

## 2024-05-05 MED ORDER — LEVOTHYROXINE SODIUM 150 MCG PO TABS: 150.0000 ug | ORAL_TABLET | Freq: Every day | ORAL | 3 refills | Status: AC

## 2024-05-05 NOTE — Progress Notes (Unsigned)
 "  Outpatient Endocrinology Note Nahmir Zeidman, MD   Patient's Name: Kyle Mcdonald.    DOB: [birth year]    MRN: [id number]  REASON OF VISIT: Postsurgical hypothyroidism status post total thyroidectomy.  PCP: de [place], Quintin PARAS, MD  REFERRING PROVIDER: de [place], Raymond J, MD  HISTORY OF PRESENT ILLNESS:   Kyle Plucinski. is a 58 y.o. old male with past medical history as listed below is presented for postsurgical hypothyroidism, status post total thyroidectomy, postoperative follow-up.  Pertinent Thyroid  History :   Patient has postsurgical hypothyroidism, status post total thyroidectomy on December 2012 2025 for multinodular goiter with compressive features.  Background: - Patient was referred to endocrinology for evaluation and management of known thyroid  nodules.  Initial consult on September 2025. - Patient was known to have thyroid  nodule for 20+ years.  He reports he was diagnosed when he was in [place] , had needle biopsy 20+ years ago when thyroid  nodule was around 3 cm in [place]  and reportedly benign at that time.  As per initial visit, patient reports right thyroid  nodule has been gradually enlarging over 7 to 8 years, and lately in the last few months he is having neck compressive symptoms with difficulty breathing on the left lateral side, intermittent dysphagia.  He denies change in voice.  No choking. - Patient had ultrasound thyroid  in May 06, 2022 reviewed images showed large solid almost completely in the mid right lobe measuring 8.2 x 5.9 x 4.5 cm and left inferior thyroid  nodule measuring 1.9 x 1.4 x 1.2 cm solid, hypoechoic, left inferior thyroid  nodule 1.3 x 1.2 x 1.0 cm.  There was a plan by primary care provider for referral to endocrinology and thyroid  needle biopsy.  Patient reports he is not aware about the plan he has not had needle biopsy of thyroid  lately. - Patient had CT chest on January 18, 2024 reviewed images noted right thyroid  mass  measuring 6.4 x 7.0 cm with leftward shift of trachea with compressive features.  12 mm low right paratracheal lymph node, similar. No pathologically enlarged mediastinal or axillary lymph nodes.  - September 2025 : ultrasound thyroid  reviewed large right thyroid  nodule measuring 7.1 cm and multiple left thyroid  nodules including 2.9 cm, 1.9 cm and 1.4 cm.   -CT neck in November 2025 prior to surgery :  IMPRESSION: 1. Markedly enlarged right thyroid  lobe due to a 5.9 x 5.9 x 8.1 cm heterogeneous nodule causing 2.6 cm of leftward tracheal deviation without significant tracheal compression.  LYMPH NODES: No suspicious cervical lymphadenopathy. There are a couple mildly prominent right paratracheal lymph nodes  - Patient has history of radiation exposure to head and neck for lymphoma treatment in 1980s.  Patient denies family history of thyroid  disorder or thyroid  cancer.  -In October 2025 : FNA of left superior thyroid  nodule measuring 2.9 cm and right mid thyroid  nodule imaging 7.1 cm with benign cytology.  # April 28, 2024 patient underwent total thyroidectomy by Dr.  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  This area does not show significant mitotic activity or necrosis.   # Postsurgical hypothyroidism : Patient was euthyroid not on thyroid  medication prior to thyroid  surgery, patient developed postsurgical hypothyroidism after total thyroidectomy in April 28, 2024.  Interval history: Patient underwent total thyroidectomy on December 22.  He has been recovering well.  He has been on levothyroxine  100 mg daily started after surgery.  Final pathology as noted above overall benign with cytologic atypia.  Denies new numbness and tingling of the feet, no muscle cramps.  He has also been taking calcium   supplement Tums twice a day.  He had mildly low serum calcium  after surgery.  He has not been taking vitamin D supplement.  No other complaints today.  REVIEW OF SYSTEMS:  As per history of present illness.   PAST MEDICAL HISTORY: Past Medical History:  Diagnosis Date   Cancer (HCC)    non hodgkins lymphoma 58 yr old   COPD (chronic obstructive pulmonary disease) (HCC)    GERD (gastroesophageal reflux disease)    Headache    occ. migraine  silent   Heart murmur    Hypertension    Pneumonia    Thyroid  nodule     PAST SURGICAL HISTORY: Past Surgical History:  Procedure Laterality Date   FRACTURE SURGERY Left    wrist  11 screws and a plate   THYROIDECTOMY N/A 04/28/2024   Procedure: THYROIDECTOMY;  Surgeon: Kyle Boas, MD;  Location: [facility];  Service: General;  Laterality: N/A;  TOTAL THYROIDECTOMY    ALLERGIES: Allergies  Allergen Reactions   Chlorhexidine  Gluconate Hives and Rash    FAMILY HISTORY:  Family History  Problem Relation Age of Onset   Hypertension Father     SOCIAL HISTORY: Social History   Socioeconomic History   Marital status: Married    Spouse name: Kyle Mcdonald   Number of children: 2   Years of education: Not on file   Highest education level: Not on file  Occupational History   Not on file  Tobacco Use   Smoking status: Former    Current packs/day: 0.00    Average packs/day: 1.5 packs/day for 29.0 years (43.5 ttl pk-yrs)    Types: Cigarettes    Start date: 82    Quit date: 2012    Years since quitting: 14.0    Passive exposure: Past   Smokeless tobacco: Never  Vaping Use   Vaping status: Never Used  Substance and Sexual Activity   Alcohol use: Yes    Comment: couple beers a day   Drug use: Never   Sexual activity: Yes    Birth control/protection: None  Other Topics Concern   Not on file  Social History Narrative   Not on file   Social Drivers of Health   Tobacco Use: Medium Risk (05/05/2024)   Patient History    Smoking  Tobacco Use: Former    Smokeless Tobacco Use: Never    Passive Exposure: Past  Physicist, Medical Strain: Not on file  Food Insecurity: No Food Insecurity (04/28/2024)   Epic    Worried About Programme Researcher, Broadcasting/film/video in the Last Year: Never true    Ran Out of Food in the Last Year: Never true  Transportation Needs: No Transportation Needs (04/28/2024)   Epic    Lack of Transportation (Medical): No    Lack of Transportation (Non-Medical): No  Physical Activity: Not on file  Stress: Not on file  Social Connections: Moderately Integrated (04/28/2024)   Social Connection and Isolation Panel    Frequency of Communication with Friends and Family: More than three times a week    Frequency of Social Gatherings with Friends and Family: Once a week    Attends Religious Services: Never    Database Administrator or Organizations: Yes    Attends Banker  Meetings: More than 4 times per year    Marital Status: Married  Depression (PHQ2-9): Low Risk (12/12/2023)   Depression (PHQ2-9)    PHQ-2 Score: 0  Alcohol Screen: Not on file  Housing: Low Risk (04/28/2024)   Epic    Unable to Pay for Housing in the Last Year: No    Number of Times Moved in the Last Year: 0    Homeless in the Last Year: No  Utilities: Not At Risk (04/28/2024)   Epic    Threatened with loss of utilities: No  Health Literacy: Not on file    MEDICATIONS:  Current Outpatient Medications  Medication Sig Dispense Refill   albuterol  (VENTOLIN  HFA) 108 (90 Base) MCG/ACT inhaler Inhale 2 puffs into the lungs every 6 (six) hours as needed for wheezing or shortness of breath. 34 g 6   amLODipine  (NORVASC ) 10 MG tablet Take 1 tablet (10 mg total) by mouth daily. 90 tablet 1   calcium  carbonate (TUMS) 500 MG chewable tablet Chew 2 tablets (400 mg of elemental calcium  total) by mouth 2 (two) times daily. 150 tablet 1   clobetasol cream (TEMOVATE) 0.05 % Apply 1 Application topically daily as needed (spots on skin).      Fluticasone-Umeclidin-Vilant (TRELEGY ELLIPTA ) 100-62.5-25 MCG/ACT AEPB Inhale 2 puffs into the lungs daily.     lisinopril  (ZESTRIL ) 40 MG tablet Take 1 tablet (40 mg total) by mouth daily. 90 tablet 2   metoprolol  tartrate (LOPRESSOR ) 25 MG tablet Take 1 tablet (25 mg total) by mouth 2 (two) times daily. (Patient taking differently: Take 50 mg by mouth in the morning.) 180 tablet 3   pantoprazole  (PROTONIX ) 40 MG tablet Take 1 tablet (40 mg total) by mouth daily. 90 tablet 2   rosuvastatin  (CRESTOR ) 20 MG tablet Take 1 tablet (20 mg total) by mouth daily. 90 tablet 1   Tiotropium Bromide-Olodaterol (STIOLTO RESPIMAT ) 2.5-2.5 MCG/ACT AERS Inhale 2 puffs into the lungs daily. 12 each 4   levothyroxine  (SYNTHROID ) 150 MCG tablet Take 1 tablet (150 mcg total) by mouth daily before breakfast. 90 tablet 3   No current facility-administered medications for this visit.    PHYSICAL EXAM: Vitals:   05/05/24 0845  BP: 136/84  Pulse: 78  Resp: 16  SpO2: 96%  Weight: 225 lb 3.2 oz (102.2 kg)  Height: 5' 9 (1.753 m)   Body mass index is 33.26 kg/m.  Wt Readings from Last 3 Encounters:  05/05/24 225 lb 3.2 oz (102.2 kg)  04/28/24 223 lb (101.2 kg)  04/11/24 223 lb (101.2 kg)     General: Well developed, well nourished male in no apparent distress.  HEENT: AT/[place], no external lesions. Hearing intact to the spoken word Eyes: Conjunctiva clear and no icterus. Neck: Surgical wound, no erythema no discharge. Lungs: Clear to auscultation, no wheeze. Respirations not labored Heart: S1S2, Regular in rate and rhythm.  Abdomen: Soft, non tender Neurologic: Alert, oriented, normal speech, deep tendon biceps reflexes normal,  no gross focal neurological deficit Extremities: Trace pedal pitting edema, no tremors of outstretched hands Skin: Warm, color good.  Psychiatric: Does not appear depressed or anxious  PERTINENT HISTORIC LABORATORY AND IMAGING STUDIES:  All pertinent laboratory results were  reviewed. Please see HPI also for further details.   TSH  Date Value Ref Range Status  01/31/2024 4.04 0.40 - 4.50 mIU/L Final  02/13/2022 6.270 (H) 0.450 - 4.500 uIU/mL Final     FINAL MICROSCOPIC DIAGNOSIS: April 28, 2024.  A. THYROID , TOTAL, THYROIDECTOMY:  Right lobe follicular adenomatous nodule, 7.7 cm with extensive hemorrhage and necrosis. 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 See comment.  B. THYROID , PYRAMIDAL LOBE, THYROIDECTOMY: Benign thyroid .  COMMENT: There is a 7.7 cm circumscribed nodule in the right lobe with a fibrous capsule.  The nodule consists predominantly of hemorrhage and degenerative changes with focal non-hemorrhagic thyroid  follicles at the periphery most consistent with a large adenomatous nodule with extensive hemorrhage and necrosis. The left lobe shows multiple nodules composed of follicles with some showing diffuse f cell change.  One of the Hurthle cell nodules has a small focus with marked cytologic atypia characterized by nuclear enlargement and irregularity, features most consistent with endocrine atypia.  This area does not show significant mitotic activity or necrosis.   - Thyroid  US  on March 06, 2022: reviewed images showed   CLINICAL DATA:  Goiter.   elevated TSH, normal free T4, TPO negative   EXAM: THYROID  ULTRASOUND   TECHNIQUE: Ultrasound examination of the thyroid  gland and adjacent soft tissues was performed.   COMPARISON:  None Available.   FINDINGS: Parenchymal Echotexture: Markedly heterogenous   Isthmus: 0.3 cm   Right lobe: 9.6 x 5.4 x 5.0 cm   Left lobe: 4.9 x 2.0 x 1.0 cm   _________________________________________________________   Estimated total number of nodules >/= 1 cm: 3   Number of spongiform nodules >/=  2 cm not described below (TR1): 0   Number of mixed cystic and solid nodules >/= 1.5 cm not described below  (TR2): 0   _________________________________________________________   Nodule # 1:   Location: RIGHT; Mid   Maximum size: 8.2 cm; Other 2 dimensions: 5.9 x 4.5 cm   Composition: solid/almost completely solid (2)   Echogenicity: isoechoic (1)   Shape: not taller-than-wide (0)   Margins: ill-defined (0)   Echogenic foci: none (0)   ACR TI-RADS total points: 3.   ACR TI-RADS risk category: TR3 (3 points).   ACR TI-RADS recommendations:   **Given size (>/= 2.5 cm) and appearance, fine needle aspiration of this mildly suspicious nodule should be considered based on TI-RADS criteria.   _________________________________________________________   Nodule # 2:   Location: LEFT; Inferior   Maximum size: 1.9 cm; Other 2 dimensions: 1.4 x 1.2 cm   Composition: solid/almost completely solid (2)   Echogenicity: hypoechoic (2)   Shape: not taller-than-wide (0)   Margins: ill-defined (0)   Echogenic foci: none (0)   ACR TI-RADS total points: 4.   ACR TI-RADS risk category: TR4 (4-6 points).   ACR TI-RADS recommendations:   **Given size (>/= 1.5 cm) and appearance, fine needle aspiration of this moderately suspicious nodule should be considered based on TI-RADS criteria.   _________________________________________________________   Nodule # 3:   Location: LEFT; Inferior   Maximum size: 1.3 cm; Other 2 dimensions: 1.2 x 1.0 cm   Composition: solid/almost completely solid (2)   Echogenicity: hypoechoic (2)   Shape: not taller-than-wide (0)   Margins: ill-defined (0)   Echogenic foci: none (0)   ACR TI-RADS total points: 4.   ACR TI-RADS risk category: TR4 (4-6 points).   ACR TI-RADS recommendations:   *Given size (>/= 1 - 1.4 cm) and appearance, a follow-up ultrasound in 1 year should be considered based on TI-RADS criteria.   _________________________________________________________   No cervical adenopathy or abnormal fluid collection within  the imaged neck.   IMPRESSION: 1. Enlarged, multinodular thyroid  gland consistent with a goiter. 2.  8.2 cm RIGHT mid TR-3 and 1.9 cm LEFT inferior TR-4 thyroid  nodules. Fine needle aspiration of these nodules should be considered based on TI-RADS criteria. 3. 1.3 cm LEFT inferior TR-4 thyroid  nodule. A follow-up ultrasound in 1 year should be considered based on TI-RADS criteria.   The above is in keeping with the ACR TI-RADS recommendations - J Am Coll Radiol 2017;14:587-595.   ASSESSMENT / PLAN  1. Postsurgical hypothyroidism   2. Hypocalcemia   3. S/P total thyroidectomy   4. Hx of toxic multinodular goiter    Patient underwent total thyroidectomy for large multinodular goiter with compressive features on April 28, 2024.  Surgical pathology overall benign with one of the Hurthle cell nodules has a small focus of marked cytologic atypia characterized by nuclear enlargement and irregularity with no significant mitotic activity or necrosis.  Unclear/undefined significance of cytologic  atypia however not obviously thyroid  carcinoma.  Reviewed NCCN and ATA thyroid  cancer guideline.  Discussed and patient agreed to manage as benign pathology with no thyroid  cancer.  Will consider checking thyroglobulin panel in the future.  He is known to have goiter for several years 20+ years he underwent FNA many years ago when he was in [place]  reportedly benign.  He had needle biopsy of 2 thyroid  nodules in October 2025 : FNA of left superior thyroid  nodule measuring 2.9 cm and right mid thyroid  nodule imaging 7.1 cm with benign cytology.    No suspicious cervical lymphadenopathy on CT scan and ultrasound.  Postsurgical hypothyroidism : Status post total thyroidectomy, he was not on thyroid  medication prior to surgery.  Plan: - He has been taking levothyroxine  100 mcg daily postoperatively.  Based on weight-based dosing, I would like to increase his levothyroxine  to 150 mcg daily. - He had  mildly low serum calcium , currently taking Tums 2 times a day.  Advised to continue on it.  He has a follow-up with surgeon next week, possibly he will have lab.  Will plan checking lab for calcium  prior to follow-up visit. - Check thyroid  function test, serum calcium , albumin, vitamin D and PTH prior to follow-up visit.  Diagnoses and all orders for this visit:  Postsurgical hypothyroidism -     levothyroxine  (SYNTHROID ) 150 MCG tablet; Take 1 tablet (150 mcg total) by mouth daily before breakfast. -     TSH -     T4, free  Hypocalcemia -     Calcium  -     Albumin -     Parathyroid hormone, intact (no Ca) -     VITAMIN D 25 Hydroxy (Vit-D Deficiency, Fractures)  S/P total thyroidectomy  Hx of toxic multinodular goiter   DISPOSITION Follow up in clinic in 2 months suggested.  Labs prior to follow-up visit as ordered.  All questions answered and patient verbalized understanding of the plan.  Keairra Bardon, MD [facility] [address] [place], [address] [place], [place] [address] Phone # [phone]  At least part of this note was generated using voice recognition software. Inadvertent word errors may have occurred, which were not recognized during the proofreading process. "

## 2024-06-05 ENCOUNTER — Telehealth (HOSPITAL_BASED_OUTPATIENT_CLINIC_OR_DEPARTMENT_OTHER): Payer: Self-pay

## 2024-06-05 DIAGNOSIS — J449 Chronic obstructive pulmonary disease, unspecified: Secondary | ICD-10-CM

## 2024-06-05 MED ORDER — TRELEGY ELLIPTA 100-62.5-25 MCG/ACT IN AEPB: 2.0000 | INHALATION_SPRAY | Freq: Every day | RESPIRATORY_TRACT | 1 refills | Status: AC

## 2024-06-05 NOTE — Telephone Encounter (Signed)
 Copied from CRM [id number]. Topic: Clinical - Prescription Issue >> Jun 05, 2024  1:23 PM Delon T wrote: Reason for CRM: per insurance all prescriptions now need to go to [facility]- Fluticasone-Umeclidin-Vilant (TRELEGY ELLIPTA ) [id number] MCG/ACT AEPB was supposed to be picked up yesterday but [facility] wanted too much for it- please send all refills to [facility]

## 2024-06-05 NOTE — Telephone Encounter (Signed)
 Trelegy Ellipta  has been sent to [facility].

## 2024-06-10 ENCOUNTER — Other Ambulatory Visit (HOSPITAL_BASED_OUTPATIENT_CLINIC_OR_DEPARTMENT_OTHER): Payer: Self-pay

## 2024-06-10 MED ORDER — PANTOPRAZOLE SODIUM 40 MG PO TBEC: 40.0000 mg | DELAYED_RELEASE_TABLET | Freq: Every day | ORAL | 2 refills | Status: DC

## 2024-06-13 ENCOUNTER — Other Ambulatory Visit (HOSPITAL_BASED_OUTPATIENT_CLINIC_OR_DEPARTMENT_OTHER): Payer: Self-pay | Admitting: *Deleted

## 2024-06-13 MED ORDER — PANTOPRAZOLE SODIUM 40 MG PO TBEC: 40.0000 mg | DELAYED_RELEASE_TABLET | Freq: Every day | ORAL | 2 refills | Status: AC

## 2024-07-11 ENCOUNTER — Other Ambulatory Visit

## 2024-07-18 ENCOUNTER — Ambulatory Visit: Admitting: Endocrinology

## 2024-07-28 ENCOUNTER — Ambulatory Visit (HOSPITAL_BASED_OUTPATIENT_CLINIC_OR_DEPARTMENT_OTHER): Admitting: Family Medicine
